# Patient Record
Sex: Female | Born: 1935 | Race: White | Hispanic: No | Smoking: Never smoker
Health system: Southern US, Community
[De-identification: ages and names within clinical notes are randomized; demographics above are authoritative.]

## PROBLEM LIST (undated history)

## (undated) DIAGNOSIS — E079 Disorder of thyroid, unspecified: Secondary | ICD-10-CM

## (undated) DIAGNOSIS — I10 Essential (primary) hypertension: Secondary | ICD-10-CM

## (undated) DIAGNOSIS — M549 Dorsalgia, unspecified: Secondary | ICD-10-CM

## (undated) DIAGNOSIS — E039 Hypothyroidism, unspecified: Secondary | ICD-10-CM

## (undated) DIAGNOSIS — K269 Duodenal ulcer, unspecified as acute or chronic, without hemorrhage or perforation: Secondary | ICD-10-CM

## (undated) DIAGNOSIS — C801 Malignant (primary) neoplasm, unspecified: Secondary | ICD-10-CM

## (undated) DIAGNOSIS — M4800 Spinal stenosis, site unspecified: Secondary | ICD-10-CM

## (undated) DIAGNOSIS — Z974 Presence of external hearing-aid: Secondary | ICD-10-CM

## (undated) DIAGNOSIS — R600 Localized edema: Secondary | ICD-10-CM

## (undated) DIAGNOSIS — M1990 Unspecified osteoarthritis, unspecified site: Secondary | ICD-10-CM

## (undated) DIAGNOSIS — R011 Cardiac murmur, unspecified: Secondary | ICD-10-CM

## (undated) DIAGNOSIS — E785 Hyperlipidemia, unspecified: Secondary | ICD-10-CM

## (undated) HISTORY — DX: Essential (primary) hypertension: I10

## (undated) HISTORY — DX: Localized edema: R60.0

## (undated) HISTORY — DX: Hyperlipidemia, unspecified: E78.5

## (undated) HISTORY — PX: EYE SURGERY: SHX253

## (undated) HISTORY — PX: TONSILLECTOMY: SUR1361

## (undated) HISTORY — DX: Unspecified osteoarthritis, unspecified site: M19.90

## (undated) HISTORY — DX: Disorder of thyroid, unspecified: E07.9

## (undated) HISTORY — PX: COLONOSCOPY: SHX174

## (undated) HISTORY — PX: ABDOMINAL HYSTERECTOMY: SHX81

## (undated) HISTORY — DX: Malignant (primary) neoplasm, unspecified: C80.1

## (undated) HISTORY — DX: Dorsalgia, unspecified: M54.9

---

## 1967-03-27 HISTORY — PX: HERNIA REPAIR: SHX51

## 1994-03-26 DIAGNOSIS — C801 Malignant (primary) neoplasm, unspecified: Secondary | ICD-10-CM

## 1994-03-26 HISTORY — PX: BREAST SURGERY: SHX581

## 1994-03-26 HISTORY — DX: Malignant (primary) neoplasm, unspecified: C80.1

## 1997-08-27 ENCOUNTER — Ambulatory Visit (HOSPITAL_COMMUNITY): Admission: RE | Admit: 1997-08-27 | Discharge: 1997-08-27 | Payer: Self-pay | Admitting: Orthopedic Surgery

## 1997-08-31 ENCOUNTER — Other Ambulatory Visit: Admission: RE | Admit: 1997-08-31 | Discharge: 1997-08-31 | Payer: Self-pay | Admitting: Urology

## 1997-09-09 ENCOUNTER — Ambulatory Visit (HOSPITAL_COMMUNITY): Admission: RE | Admit: 1997-09-09 | Discharge: 1997-09-09 | Payer: Self-pay | Admitting: Gastroenterology

## 1998-10-21 ENCOUNTER — Other Ambulatory Visit: Admission: RE | Admit: 1998-10-21 | Discharge: 1998-10-21 | Payer: Self-pay | Admitting: Rheumatology

## 1999-01-30 ENCOUNTER — Encounter: Payer: Self-pay | Admitting: Rheumatology

## 1999-01-30 ENCOUNTER — Encounter: Admission: RE | Admit: 1999-01-30 | Discharge: 1999-01-30 | Payer: Self-pay | Admitting: Rheumatology

## 2000-01-31 ENCOUNTER — Encounter: Payer: Self-pay | Admitting: General Surgery

## 2000-01-31 ENCOUNTER — Encounter: Admission: RE | Admit: 2000-01-31 | Discharge: 2000-01-31 | Payer: Self-pay | Admitting: General Surgery

## 2000-07-16 ENCOUNTER — Encounter: Admission: RE | Admit: 2000-07-16 | Discharge: 2000-07-16 | Payer: Self-pay | Admitting: Rheumatology

## 2000-07-16 ENCOUNTER — Encounter: Payer: Self-pay | Admitting: Rheumatology

## 2001-01-31 ENCOUNTER — Encounter: Admission: RE | Admit: 2001-01-31 | Discharge: 2001-01-31 | Payer: Self-pay | Admitting: Rheumatology

## 2001-01-31 ENCOUNTER — Encounter: Payer: Self-pay | Admitting: Rheumatology

## 2001-05-27 ENCOUNTER — Ambulatory Visit (HOSPITAL_COMMUNITY): Admission: RE | Admit: 2001-05-27 | Discharge: 2001-05-27 | Payer: Self-pay | Admitting: Gastroenterology

## 2002-02-04 ENCOUNTER — Encounter: Admission: RE | Admit: 2002-02-04 | Discharge: 2002-02-04 | Payer: Self-pay | Admitting: General Surgery

## 2002-02-04 ENCOUNTER — Encounter: Payer: Self-pay | Admitting: General Surgery

## 2003-02-09 ENCOUNTER — Encounter: Admission: RE | Admit: 2003-02-09 | Discharge: 2003-02-09 | Payer: Self-pay | Admitting: General Surgery

## 2004-02-10 ENCOUNTER — Encounter: Admission: RE | Admit: 2004-02-10 | Discharge: 2004-02-10 | Payer: Self-pay | Admitting: Rheumatology

## 2004-08-16 ENCOUNTER — Encounter: Admission: RE | Admit: 2004-08-16 | Discharge: 2004-08-16 | Payer: Self-pay | Admitting: Rheumatology

## 2005-02-14 ENCOUNTER — Encounter: Admission: RE | Admit: 2005-02-14 | Discharge: 2005-02-14 | Payer: Self-pay | Admitting: General Surgery

## 2005-03-26 HISTORY — PX: ERCP: SHX60

## 2005-08-28 ENCOUNTER — Encounter: Admission: RE | Admit: 2005-08-28 | Discharge: 2005-08-28 | Payer: Self-pay | Admitting: *Deleted

## 2006-01-17 ENCOUNTER — Encounter (INDEPENDENT_AMBULATORY_CARE_PROVIDER_SITE_OTHER): Payer: Self-pay | Admitting: Specialist

## 2006-01-17 ENCOUNTER — Ambulatory Visit (HOSPITAL_COMMUNITY): Admission: RE | Admit: 2006-01-17 | Discharge: 2006-01-17 | Payer: Self-pay | Admitting: Gastroenterology

## 2006-02-18 ENCOUNTER — Encounter: Admission: RE | Admit: 2006-02-18 | Discharge: 2006-02-18 | Payer: Self-pay | Admitting: Rheumatology

## 2006-12-13 ENCOUNTER — Ambulatory Visit: Payer: Self-pay | Admitting: Surgery

## 2006-12-13 ENCOUNTER — Ambulatory Visit (HOSPITAL_COMMUNITY): Admission: RE | Admit: 2006-12-13 | Discharge: 2006-12-13 | Payer: Self-pay | Admitting: Rheumatology

## 2006-12-13 ENCOUNTER — Encounter (INDEPENDENT_AMBULATORY_CARE_PROVIDER_SITE_OTHER): Payer: Self-pay | Admitting: Rheumatology

## 2007-02-21 ENCOUNTER — Encounter: Admission: RE | Admit: 2007-02-21 | Discharge: 2007-02-21 | Payer: Self-pay | Admitting: Rheumatology

## 2007-03-27 HISTORY — PX: OTHER SURGICAL HISTORY: SHX169

## 2007-07-11 ENCOUNTER — Encounter: Admission: RE | Admit: 2007-07-11 | Discharge: 2007-07-11 | Payer: Self-pay | Admitting: Orthopedic Surgery

## 2007-07-16 ENCOUNTER — Encounter: Admission: RE | Admit: 2007-07-16 | Discharge: 2007-07-16 | Payer: Self-pay | Admitting: Orthopedic Surgery

## 2007-07-17 ENCOUNTER — Ambulatory Visit (HOSPITAL_BASED_OUTPATIENT_CLINIC_OR_DEPARTMENT_OTHER): Admission: RE | Admit: 2007-07-17 | Discharge: 2007-07-17 | Payer: Self-pay | Admitting: Orthopedic Surgery

## 2007-07-31 ENCOUNTER — Encounter: Admission: RE | Admit: 2007-07-31 | Discharge: 2007-09-25 | Payer: Self-pay | Admitting: Orthopedic Surgery

## 2008-02-23 ENCOUNTER — Encounter: Admission: RE | Admit: 2008-02-23 | Discharge: 2008-02-23 | Payer: Self-pay | Admitting: Rheumatology

## 2008-05-22 ENCOUNTER — Inpatient Hospital Stay (HOSPITAL_COMMUNITY): Admission: EM | Admit: 2008-05-22 | Discharge: 2008-05-26 | Payer: Self-pay | Admitting: Emergency Medicine

## 2008-05-24 ENCOUNTER — Encounter (INDEPENDENT_AMBULATORY_CARE_PROVIDER_SITE_OTHER): Payer: Self-pay | Admitting: Internal Medicine

## 2008-05-24 ENCOUNTER — Ambulatory Visit: Payer: Self-pay | Admitting: *Deleted

## 2009-02-23 ENCOUNTER — Encounter: Admission: RE | Admit: 2009-02-23 | Discharge: 2009-02-23 | Payer: Self-pay | Admitting: Geriatric Medicine

## 2009-03-30 ENCOUNTER — Encounter: Admission: RE | Admit: 2009-03-30 | Discharge: 2009-06-28 | Payer: Self-pay | Admitting: Orthopedic Surgery

## 2009-09-13 ENCOUNTER — Encounter: Admission: RE | Admit: 2009-09-13 | Discharge: 2009-09-13 | Payer: Self-pay | Admitting: Orthopedic Surgery

## 2009-10-13 ENCOUNTER — Encounter: Admission: RE | Admit: 2009-10-13 | Discharge: 2009-12-08 | Payer: Self-pay | Admitting: Unknown Physician Specialty

## 2010-02-04 ENCOUNTER — Inpatient Hospital Stay (HOSPITAL_COMMUNITY)
Admission: EM | Admit: 2010-02-04 | Discharge: 2010-02-05 | Payer: Self-pay | Source: Home / Self Care | Admitting: Emergency Medicine

## 2010-02-20 ENCOUNTER — Ambulatory Visit (HOSPITAL_COMMUNITY): Admission: RE | Admit: 2010-02-20 | Payer: Self-pay | Admitting: Geriatric Medicine

## 2010-02-23 ENCOUNTER — Ambulatory Visit (HOSPITAL_COMMUNITY)
Admission: RE | Admit: 2010-02-23 | Discharge: 2010-02-23 | Payer: Self-pay | Source: Home / Self Care | Admitting: Geriatric Medicine

## 2010-02-24 ENCOUNTER — Encounter: Admission: RE | Admit: 2010-02-24 | Discharge: 2010-02-24 | Payer: Self-pay | Admitting: General Surgery

## 2010-03-26 HISTORY — PX: CARPAL TUNNEL RELEASE: SHX101

## 2010-05-30 ENCOUNTER — Other Ambulatory Visit: Payer: Self-pay | Admitting: Gastroenterology

## 2010-06-06 LAB — DIFFERENTIAL
Basophils Absolute: 0 10*3/uL (ref 0.0–0.1)
Basophils Relative: 0 % (ref 0–1)
Eosinophils Absolute: 0.2 10*3/uL (ref 0.0–0.7)
Eosinophils Relative: 2 % (ref 0–5)
Lymphocytes Relative: 22 % (ref 12–46)
Lymphs Abs: 3.4 10*3/uL (ref 0.7–4.0)
Monocytes Absolute: 1.1 10*3/uL — ABNORMAL HIGH (ref 0.1–1.0)
Neutro Abs: 11.7 10*3/uL — ABNORMAL HIGH (ref 1.7–7.7)
Neutrophils Relative %: 75 % (ref 43–77)

## 2010-06-06 LAB — HEPATIC FUNCTION PANEL
ALT: 12 U/L (ref 0–35)
Bilirubin, Direct: 0.1 mg/dL (ref 0.0–0.3)
Indirect Bilirubin: 0.3 mg/dL (ref 0.3–0.9)

## 2010-06-06 LAB — GLUCOSE, CAPILLARY: Glucose-Capillary: 104 mg/dL — ABNORMAL HIGH (ref 70–99)

## 2010-06-06 LAB — CBC
MCH: 30.4 pg (ref 26.0–34.0)
MCH: 30.4 pg (ref 26.0–34.0)
MCHC: 33.4 g/dL (ref 30.0–36.0)
Platelets: 235 10*3/uL (ref 150–400)
Platelets: 278 10*3/uL (ref 150–400)
RBC: 3.26 MIL/uL — ABNORMAL LOW (ref 3.87–5.11)
RDW: 13.6 % (ref 11.5–15.5)
WBC: 15.3 10*3/uL — ABNORMAL HIGH (ref 4.0–10.5)

## 2010-06-06 LAB — POCT I-STAT, CHEM 8
Creatinine, Ser: 1.4 mg/dL — ABNORMAL HIGH (ref 0.4–1.2)
Glucose, Bld: 94 mg/dL (ref 70–99)
HCT: 33 % — ABNORMAL LOW (ref 36.0–46.0)
Hemoglobin: 11.2 g/dL — ABNORMAL LOW (ref 12.0–15.0)
TCO2: 24 mmol/L (ref 0–100)

## 2010-06-06 LAB — TSH: TSH: 1.854 u[IU]/mL (ref 0.350–4.500)

## 2010-06-06 LAB — LIPID PANEL
HDL: 56 mg/dL (ref >39)
Triglycerides: 110 mg/dL (ref <150)
VLDL: 22 mg/dL (ref 0–40)

## 2010-06-06 LAB — MAGNESIUM: Magnesium: 2 mg/dL (ref 1.5–2.5)

## 2010-06-06 LAB — COMPREHENSIVE METABOLIC PANEL
AST: 15 U/L (ref 0–37)
Albumin: 2.8 g/dL — ABNORMAL LOW (ref 3.5–5.2)
Calcium: 8.6 mg/dL (ref 8.4–10.5)
Chloride: 104 mEq/L (ref 96–112)
Creatinine, Ser: 1.01 mg/dL (ref 0.4–1.2)
GFR calc Af Amer: >60 mL/min (ref >60)
Sodium: 139 mEq/L (ref 135–145)
Total Bilirubin: 0.4 mg/dL (ref 0.3–1.2)

## 2010-06-06 LAB — LIPASE, BLOOD: Lipase: 20 U/L (ref 11–59)

## 2010-06-06 LAB — T4, FREE: Free T4: 1.23 ng/dL (ref 0.80–1.80)

## 2010-07-06 LAB — BASIC METABOLIC PANEL
BUN: 12 mg/dL (ref 6–23)
BUN: 18 mg/dL (ref 6–23)
CO2: 29 mEq/L (ref 19–32)
CO2: 31 mEq/L (ref 19–32)
Chloride: 95 mEq/L — ABNORMAL LOW (ref 96–112)
Chloride: 99 mEq/L (ref 96–112)
Creatinine, Ser: 1.03 mg/dL (ref 0.4–1.2)
Creatinine, Ser: 1.04 mg/dL (ref 0.4–1.2)

## 2010-07-11 LAB — POCT CARDIAC MARKERS
CKMB, poc: 1.8 ng/mL (ref 1.0–8.0)
Myoglobin, poc: 132 ng/mL (ref 12–200)
Troponin i, poc: <0.05 ng/mL (ref 0.00–0.09)

## 2010-07-11 LAB — CBC
HCT: 30.7 % — ABNORMAL LOW (ref 36.0–46.0)
MCV: 90.5 fL (ref 78.0–100.0)
Platelets: 387 10*3/uL (ref 150–400)
RDW: 14.4 % (ref 11.5–15.5)
WBC: 10.3 10*3/uL (ref 4.0–10.5)

## 2010-07-11 LAB — DIFFERENTIAL
Basophils Relative: 0 % (ref 0–1)
Eosinophils Absolute: 0.3 10*3/uL (ref 0.0–0.7)
Eosinophils Relative: 3 % (ref 0–5)
Lymphs Abs: 1.6 10*3/uL (ref 0.7–4.0)
Monocytes Relative: 6 % (ref 3–12)

## 2010-07-11 LAB — BASIC METABOLIC PANEL
BUN: 10 mg/dL (ref 6–23)
CO2: 28 mEq/L (ref 19–32)
Chloride: 101 mEq/L (ref 96–112)
Creatinine, Ser: 1.05 mg/dL (ref 0.4–1.2)

## 2010-07-11 LAB — COMPREHENSIVE METABOLIC PANEL
ALT: 15 U/L (ref 0–35)
AST: 17 U/L (ref 0–37)
Alkaline Phosphatase: 60 U/L (ref 39–117)
BUN: 12 mg/dL (ref 6–23)
Chloride: 102 mEq/L (ref 96–112)
Creatinine, Ser: 0.94 mg/dL (ref 0.4–1.2)

## 2010-07-11 LAB — METANEPHRINES, URINE, 24 HOUR
Metaneph Total, Ur: 506 mcg/24 h (ref 224–832)
Normetanephrine, 24H Ur: 387 mcg/24 h (ref 122–676)
Volume, Urine-METAN: 2200 mL

## 2010-07-11 LAB — LIPID PANEL
LDL Cholesterol: 130 mg/dL — ABNORMAL HIGH (ref 0–99)
Total CHOL/HDL Ratio: 4 RATIO
Triglycerides: 133 mg/dL (ref <150)
VLDL: 27 mg/dL (ref 0–40)

## 2010-07-11 LAB — CATECHOLAMINES, FRACTIONATED, URINE, 24 HOUR
Norepinephrine 24 Hr Urine: 64 ug/24hr (ref <80)
Total urine volume: 2200 mL

## 2010-08-08 NOTE — H&P (Signed)
[name], FURNEY            ACCOUNT NO.:  [id number]   MEDICAL RECORD NO.:  [id number]          PATIENT TYPE:  INP   LOCATION:  3105                         FACILITY:  [facility]   PHYSICIAN:  Corinna L. Lendell Caprice, MDDATE OF BIRTH:  Aug 20, 1926   DATE OF ADMISSION:  05/22/2008  DATE OF DISCHARGE:                              HISTORY & PHYSICAL   CHIEF COMPLAINT:  Left arm numbness and weakness.   HISTORY OF PRESENT ILLNESS:  Ms. [place] is an 77 year old white female  patient of Dr. Coral Spikes with a history of malignant hypertension, who  presents after waking up with left arm paresthesias and weakness.  She  was unable to grasp an object.  She also has some tingling of her face  according to nursing notes.  She also had a periorbital headache, which  she attributed to her chronic sinusitis.  Her symptoms have since  resolved.  She denies any shortness of breath.  She had no change in  speech, swallow, or gait.  She has never had a similar episode.  She had  no seizure activity.  She has been taking Tylenol Sinus and Tylenol  Multisystem, both of which, I believe have phenylephrine.  Also, she  reports that she has had refractory hypertension for which, she was  referred to Dr. Eldridge Dace.  The nurse practitioner has been adjusting her  medications without much result.  She also has had leg swelling and was  scheduled for an outpatient echocardiogram.  She has had no chest pain.  She has no history of heart disease.  She says that she is compliant  with diet, but when I asked what she ate for breakfast, she said a  sausage biscuit from [employer].  I suspect that she does not comply  with a low-salt diet.  She was found to have malignant hypertension in  the emergency room and was given several doses of labetalol, IV Vasotec,  Lasix, and nitroglycerin ointment.  Her blood pressure has remained  elevated.   PAST MEDICAL HISTORY:  1. Hypertension, poorly controlled.  2.  Acroarthritis.  3. Leg swelling as above.  4. Hypothyroidism.  5. Chronic sinusitis.   MEDICATIONS:  1. Nasacort as needed.  She has not been using it recently.  2. Nexium 40 mg a day.  3. Synthroid 150 mcg a day.  4. Multivitamin a day.  5. Vitamin D daily.  6. Tylenol as needed.  7. Tekturna 150 mg a day.  It had been on 300 mg a day.  8. Atenolol 50 mg a day.  9. Lisinopril 40 mg a day.  10.Cardura 2 mg a day.   SOCIAL HISTORY:  The patient does not smoke or drink.  She is fairly  independent.  She is here with her daughter, who lives in [place].  Apparently, there is a son who lives in town, who is a respiratory  therapist.   FAMILY HISTORY:  Significant for hypertension.   REVIEW OF SYSTEMS:  As above, otherwise negative.   PHYSICAL EXAMINATION:  VITAL SIGNS:  Temperature is 98, blood pressure  initially 212/85.  Her diastolic blood pressure here  in the emergency  room has been up to 120.  Currently, she has blood pressure of 200/80,  pulse 72, respiratory rate 16, oxygen saturation 94% on room air.  GENERAL:  The patient is an elderly white female in no acute distress.  HEENT:  Normocephalic, atraumatic.  Pupils equal, round, and reactive to  light.  She has no papilledema that I can appreciate, though funduscopic  exam is somewhat limited.  She has moist mucous membranes.  There is no  facial droop.  NECK:  Supple.  No carotid bruits.  No JVD.  LUNGS:  Clear to auscultation bilaterally without wheezes, rhonchi, or  rales.  CARDIOVASCULAR:  Regular rate and rhythm without murmurs, gallops, or  rubs.  ABDOMEN:  Obese, soft, nontender.  GU:  Deferred.  RECTAL:  Deferred.  EXTREMITIES:  2+ pitting edema bilaterally.  She has no calf tenderness.  Homans sign negative.  SKIN:  No rash.  PSYCHIATRIC:  The patient is calm and cooperative with normal affect.  NEUROLOGIC:  Alert and oriented x3.  Cranial nerves are intact.  Motor  strength 5/5 throughout.  Diminished deep  tendon reflexes, but  symmetric.  Babinski negative.  I did not test gait.  Finger-to-nose  normal.  Speech clear and fluent.   LABORATORY DATA:  CBC significant for hemoglobin of 10.7, hematocrit  30.7.  PT/PTT normal.  Complete metabolic panel significant for  potassium of 3.1, otherwise unremarkable.  BNP is 275.  Point-of-care  enzymes negative.  EKG shows normal sinus rhythm with PACs, wandering  baseline.  Chest x-ray shows cardiomegaly, interstitial edema, and tiny  bilateral pleural effusions.  CT brain shows nothing acute.  ED  physician ordered an x-ray of the C-spine, which showed degenerative  disk disease.   ASSESSMENT AND PLAN:  1. Hypertensive urgency:  The patient has congestive heart failure.  I      will start a nitroglycerin drip and titrate to a systolic blood      pressure of less than 170.  I will continue her outpatient      medications, increase Cardura to 4 mg a day.  She will be placed in      step-down unit.  I will review outpatient records to see what type      of workup has been done for her uncontrolled hypertension.  It      sounds as though she has not had a workup for pheochromocytoma or      renovascular hypertension.  The electronic medical records are      currently down.  2. Transient ischemic attack.  She will get an MRI, carotid Dopplers,      fasting lipids, homocysteine, echocardiogram.  I will continue      aspirin, but change it to 325 mg a day.  3. New-onset congestive heart failure.  She will get Lasix.  She needs      better blood pressure control.  I suspect she has at least a      component of diastolic dysfunction, but rule out systolic      dysfunction.  She needs tight blood pressure control.  I will check      a TSH as well.  4. Hypothyroidism.  Check TSH.  5. Chronic sinusitis.  Certainly, her phenylephrine use may be      contributing to her malignant hypertension and I      have recommended that she not resume this upon  discharge.  6. Osteoarthritis.  7. Hypokalemia.  This will be repleted.      Corinna L. Lendell Caprice, MD  Electronically Signed     CLS/MEDQ  D:  05/23/2008  T:  05/23/2008  Job:  [id number]   cc:   Corky Crafts, MD  Demetria Pore. Coral Spikes, M.D.

## 2010-08-08 NOTE — Op Note (Signed)
[name], [name]            ACCOUNT NO.:  [id number]   MEDICAL RECORD NO.:  [id number]          PATIENT TYPE:  AMB   LOCATION:  DSC                          FACILITY:  [facility]   PHYSICIAN:  Rodney A. Mortenson, M.D.DATE OF BIRTH:  22-Aug-1926   DATE OF PROCEDURE:  DATE OF DISCHARGE:                               OPERATIVE REPORT   JUSTIFICATION:  A 77 year old female presents with 6-week history of  significant pain about the right shoulder.  No radicular symptoms in the  upper extremity.  She has inability to abduct her shoulder with great  deal of pain.  She has significant weakness to external rotation.  Impingement test is very painful.  X-rays show a type 2 acromion.  MRI  was done that shows a partial tear of the biceps and a full-thickness  rupture of the supraspinatus with retraction about 3 to 4 cm and full-  width tear of the infraspinatus with retraction.  Because of persistent  pain, discomfort, and a massive size of the cuff tear, it was felt that  surgical intervention was indicated necessary.  Questions were answered  preoperatively.  Complication was discussed.   PREOPERATIVE DIAGNOSIS:  Partial tear, right biceps; full-thickness  retracted tear of supraspinatus, infraspinatus, right shoulder.   POSTOPERATIVE DIAGNOSIS:  Partial tear right biceps; full-thickness  retracted tear of supraspinatus, infraspinatus right shoulder.   OPERATION:  Arthroscopic debridement, biceps, labrum; open  subacromioplasty and open repair, rotator cuff using a large Biomet  corkscrew with sutures.   SURGEON:  Lenard Galloway. Chaney Malling, M.D.   ANESTHESIA:  General.   PROCEDURE:  The patient placed on the operating table in supine  position.  After satisfactory general anesthesia, the patient was placed  in a semi-sitting position.  The right upper extremity was prepped with  DuraPrep and draped down in the usual manner.  Through a posterior  portal, the arthroscope was introduced and  the anterior operative portal  was inserted.  Glenohumeral joint was very careful examined.  The  articular cartilage of the humeral head and glenoid appeared normal.  There was some fraying of the labrum, and there was a partial tear about  50% with the biceps, which was shredded and frayed and hanging down into  the joint.  There is a massive  rotator cuff tear and from the  glenohumeral joint one could see the acromion with a large retracted  tear.  A Gator resector was entered anteriorly.  Labrum was debrided.  The fraying and tearing of the biceps also debrided.  Part of the cuff  was then trimmed back.  Once this accomplished to my satisfaction, the  arthroscope was then removed.   Saber-cut incision made over the anterolateral aspect of right shoulder.  Skin edges were retracted.  Bleeders were coagulated.  Deltoid fibers  released off the anterior aspect of acromion for about a centimeter to  the centimeter and a half.  The fibers were then split distally.  Excellent visualization was achieved.  There was a massive a rotator  cuff tear.  The supraspinatus and infraspinatus were totally avulsed  from the humeral head.  It was totally bare and the footprint could  clearly be seen.  The biceps was in the groove.  The footprint was then  debrided with a rongeur.  Two leaves of the rotator cuff could be pulled  down to the humeral head in the area of the footprint.  A large  corkscrew Biomet screw was inserted, and the FiberWire sutures were used  to pass through posterior leaf in the anterior leaf, then sutured to  each other.  The cuff was pulled down to an anatomic position, and  excellent, very stable repair was achieved.  Several other sutures were  passed in to complete the repair.  The shoulders were put through full  range of motion.  There was no impingement on the undersurface acromion,  which had been partially resected earlier in the case.  The shoulder was  irrigated with  copious amount of saline solution.  Deltoid fibers were  reattached with 0-Vicryl; 2-0 Vicryl is used to close the subcutaneous  tissue, and stainless steel staples used to close the skin.  Sterile  dressing applied, and the patient returned to the recovery room in  excellent condition.  Technically, this procedure went extremely well.   DISPOSITION:  1. Discharge to home.  2. Percocet for pain.  3. To my office on Wednesday.  4. We will start immediate physical therapy.  A watertight closure was      achieved.      Rodney A. Chaney Malling, M.D.  Electronically Signed     RAM/MEDQ  D:  07/17/2007  T:  07/18/2007  Job:  [id number]

## 2010-08-08 NOTE — Consult Note (Signed)
NAMEDANIYLA, PFAHLER            ACCOUNT NO.:  [id number]   MEDICAL RECORD NO.:  [id number]          PATIENT TYPE:  INP   LOCATION:  3712                         FACILITY:  [facility]   PHYSICIAN:  Marlan Palau, M.D.  DATE OF BIRTH:  06-19-1926   DATE OF CONSULTATION:  05/24/2008  DATE OF DISCHARGE:                                 CONSULTATION   REASON FOR CONSULTATION:  Left arm weakness.   HISTORY OF PRESENT ILLNESS:  Ms. Pickron is an 77 year old white female  who is a patient with a significant past medical history.  However, her  past medical history does not include TIA or stroke.  The patient does  have a history of migraine headaches but has not had migraine headaches  in quite a long time.  The patient states that on Saturday morning, she  woke up and drove her son to a designated location, got back in her car,  drove to [employer] to pick up a biscuit.  On her way home she noticed  some tingling in her left arm.  By the time she had arrived at her  house, the patient noted that she was unable to grasp objects with her  left hand and she had numbness and tingling from her fingertips to  just above her elbow.  Although Ms. Miao does have bursitis/tendinitis  of her left shoulder, she noticed significant amount of weakness and  inability to move that arm.  She states that this weakness and tingling  lasted for approximately 1 hour and all symptoms resolved.  In addition  to these problems, the patient also has very labile hypertension.  Dr.  Hoyle Barr nurse practitioner has been adjusting the patient's  medications without much result.  In the past, she has been having some  significant amount of leg swelling and was scheduled for outpatient  echocardiogram.  According to notes on May 19, 2008, the patient's  BNP returned at the level of 676.  The patient was started on Lasix 20  mg p.o. daily for 5 days only.  She was also given of TED hose.  At  present time, the  patient is alert.  She is not complaining of any  paresthesia, numbness, tingling, weakness, dysarthria, difficulty  swallowing, difficulty with vision, difficulty walking, abdominal pain,  shortness of breath, or chest pain.  She believes that she does well  with her diet but does admit that she needs to work on her cholesterol  and exercise more.  However, the patient has significant amount of  osteoarthritis and finds it very difficult.  According to the ED notes,  on the patient's arrival to the ED on May 22, 2008, her blood  pressure was 212/85 at 10:33, brought down to 172/86 by 1846.  At  present time, the patient's blood pressure is back to 178/80.  As  stated, the patient does not feel any chest pain.   MEDICATIONS:  She is currently on:  1. Nexium 40 mg as directed.  2. Synthroid 115 mcg 1 tablet every morning on empty stomach once a      day.  3.  Multivitamin daily.  4. Vitamin D 1 tablet daily.  5. Tylenol Extra Strength 500 mg 2 tablets daily.  6. Tekturna 150 mg 1 tablet daily.  7. Atenolol 50 mg 1 tablet daily.  8. Lisinopril 40 mg 1 tablet daily.  9. Cardura 2 mg 1 tablet daily.  The patient states that she does take aspirin 81 mg, and she has been  started on 325 mg daily during this visit.   PAST MEDICAL HISTORY:  1. Status post RAI treatment for hyperthyroidism, subsequent      hypothyroid on treatment.  2. Hypertension.  3. Proteinuria secondary to hypertension.  4. Hyperlipidemia.  5. Bilateral cataracts.  6. Microhematuria.  7. EKG nonspecific ST changes  8. Impaired sugar.  9. Osteoarthritis to the left knee.  10.Status post low back pain in 2007.  X-ray shows facet DJD and      spondylolisthesis, L4-5.  11.Esophageal stricture with dysphagia in 2007 with food impaction      removed endoscopically with stricture but not dilated.  Subsequent      dilation of stricture.  12.Allergic rhinitis.  13.Ganglion on left second finger, resolved.   14.Hypertrophy of left ventricle, systolic function ejection fraction      65-70%, at that time no further workup.   ALLERGIES:  AMLODIPINE, swelling is the side effect.   FAMILY HISTORY:  Father deceased at 95 years of age.  Mother deceased at  45.   SOCIAL HISTORY:  Does not smoke, does not drink.  Caffeine one serving  per day.  Exercise, none.  Marital status, she is a widow.  Children, 2,  living at this time.   REVIEW OF SYSTEMS:  All negative with the exception of above.   PHYSICAL EXAMINATION:  VITAL SIGNS:  Blood pressure is 178/80, pulse 69,  respiratory rate 20, and temperature 97.9.  NEUROLOGIC:  Mental status:  She is alert and she is oriented x3.  She  holds the conversation well, oftentimes getting sidetracked.  However,  she is extremely compliant and very knowledgeable about her past.  Cranial nerves:  Pupils equal, round, and reactive to light and  accommodating, conjugate gaze.  Extraocular muscles intact.  Visual  fields intact.  Face symmetrical.  Tongue midline.  Uvula midline.  Sensation V1 through V3 is full.  Shoulder shrug, head turn full.  Coordination:  Finger-to-nose is smooth.  Heel-to-shin is smooth.  Fine  motor movements are within normal limits.  Gait is slow.  Stoop  secondary to kyphosis.  Motor is 5/5 in all extremities.  No drift.  No  clonus.  She has good bulk and good tone.  She has a decreased range of  motion in her left knee secondary to osteoarthritis.  Deep tendon  reflexes are 2+ throughout with bilateral downgoing toes.  Sensation is  full with the exception of distal stocking distribution bilaterally in  the legs to pinprick, light touch, and vibration.  PULMONARY:  Clear to auscultation bilaterally.  No rhonchi or wheezing.  CARDIOVASCULAR:  S1 and S2.  Regular rate and rhythm.  No murmurs.  NECK:  Negative for bruits and supple.   LABORATORY DATA:  Her TSH was within normal limits.  Homocysteine 17.5.  Hemoglobin is 10.7,  hematocrit is 30.7, white blood cell is 10.3, and  platelets are 387.  Capillary glucose 103.  Sodium 135, potassium 3.4,  chloride 99, CO2 of 31, BUN 12, creatinine 1.04, and calcium 8.7.  CK is  1.8, troponin is less than 0.05,  myoglobin is 132, and BNP is 275.  Cholesterol 209, triglycerides 133, HDL is 52, and LDL is 130.  TSH is  2.442.   IMAGING TEST:  MRI of the brain shows 3 punctate foci of acute infarct  in the right MCA territory.  Preliminary carotid Doppler showed no ICA  stenosis bilaterally.  CT of head shows negative for bleed or mass.   ASSESSMENT:  At this time, this is a pleasant 77 year old Caucasian  female with acute onset of left arm weakness and numbness which resolved  in approximately 1 hour.  MRI showing punctate lesions in the right MCA  distribution.  The patient has already been on 81 mg of aspirin.  We  would recommend increasing to 325 mg aspirin daily.  We will obtain a 2-  D echo.  We will obtain an MRA of the head and MRA of the neck.  We will  continue to follow this patient while she is in-house.  Thank you very  much for this consultation.     ______________________________  Felicie Morn, PA-C      C. Lesia Sago, M.D.  Electronically Signed    DS/MEDQ  D:  05/24/2008  T:  05/25/2008  Job:  [id number]   cc:   [employer] [employer]  Marlan Palau, M.D.

## 2010-08-11 NOTE — Op Note (Signed)
[name], [name]            ACCOUNT NO.:  [id number]   MEDICAL RECORD NO.:  [id number]          PATIENT TYPE:  AMB   LOCATION:  ENDO                         FACILITY:  [facility]   PHYSICIAN:  Petra Kuba, M.D.    DATE OF BIRTH:  Sep 17, 1926   DATE OF PROCEDURE:  01/17/2006  DATE OF DISCHARGE:                                 OPERATIVE REPORT   PROCEDURE:  Esophagogastroduodenoscopy with biopsy of food disimpaction.   INDICATIONS:  Food impaction.  Consent was signed after risks, benefits,  methods, and options were thoroughly discussed by both the nurse  practitioner [name] as well as me prior to sedation.   MEDICINES USED:  Fentanyl 50 mcg, Versed 6 mg.   PROCEDURE:  The video duodenoscope was inserted by direct vision.  The  proximal and mid esophagus was normal. In the distal esophagus was an  obvious piece of food obstructing the esophagus. We went ahead and snared it  but unfortunately the snare cut through it and we went ahead and snared  multiple pieces and cut them into smaller pieces using the snare.  One piece  we did not cut through and this was suctioned onto the head of the scope and  the piece of meat and scope were removed, the piece was recovered. When we  reinserted the scope, there were just a few small particles left in the  esophagus which could easily be suctioned through the scope or pushed into  the stomach. The GE junction was patent.  She did have a small stricture at  the GE junction with minimal inflammation.  We withdrew back through the  esophagus.  No food was seen.  We went ahead and inserted the stomach and  advanced to the antrum where a small sessile antral polyp was seen.  This  was cold biopsied x4. The pylorus was scarred and moderately patent and we  could advanced into a normal duodenal bulb around the C-loop to a normal  second portion of the duodenum.  The scope was withdrawn back to the bulb  and a good look there ruled out ulcers in  that location.  The scope was  withdrawn back to the stomach and retroflexed.  Angularis, cardia, fundus,  lesser and greater curve were all normal except for possibly some atrophic  gastritis and some food in the proximal part of the stomach. Straight  visualization of the stomach did not reveal any additional findings.  The  biopsies of the antral polyps were obtained at this junction.  Air was  suctioned, the scope slowly withdrawn. Again, a good look at the esophagus  was normal except for the distal minimal stricture and inflammation.  The  scope was withdrawn.  The patient tolerated the procedure well.  There was  no obvious immediate complication.   ENDOSCOPIC DIAGNOSES:  1. Food in the distal esophagus status post multiple snares.  2. Distal esophageal stricture with minimal inflammation.  3. Antral sessile polyp, small to medium size status post biopsy.  4. Scarred but patent pylorus.  5. Otherwise, within normal limits EGD.   PLAN:  Await pathology.  May need more aggressive polypectomy, dilation  p.r.n. per Dr. Sherin Schroeder.  We may want to get a barium swallow and barium  tablet to get an idea of how significant this stricture is.  We will go  ahead and  hold aspirin and nonsteroidals for three days. Clear liquids only for four  hours and then slowly advance to soft solids as tolerated.  We will ask her  to get a week of over-the-counter Prilosec. Happy to see back p.r.n.,  otherwise, return care to Dr. Sherin Schroeder for follow up on the path and her  symptoms.           ______________________________  Petra Kuba, M.D.     MEM/MEDQ  D:  01/17/2006  T:  01/18/2006  Job:  [id number]

## 2010-08-11 NOTE — Consult Note (Signed)
NAME:  [name], [name]            ACCOUNT NO.:  [id number]   MEDICAL RECORD NO.:  [id number]          PATIENT TYPE:  AMB   LOCATION:  ENDO                         FACILITY:  [facility]   PHYSICIAN:  Petra Kuba, M.D.    DATE OF BIRTH:  Feb 21, 1927   DATE OF CONSULTATION:  01/17/2006  DATE OF DISCHARGE:                                   CONSULTATION   HISTORY:  The patient was seen at the request of Dr. Patsi Sears, medical  nurse practitioner, and Dr. Boyd Kerbs with a probable food impaction.  She has had this once a month for the last few years.  Usually it is when  she is out to lunch.  This time it was with beef stew.  She was able to  cough much of it up, which she usually does, but she still cannot swallow  water.  It feels like something is caught.  She is really not hurting.  It  has never lasted this long.  She does not have any other reflux or  heartburn.  Has not talked to her doctor about any of this.  Has had no  previous upper tract workup.   PAST MEDICAL HISTORY:  Pertinent for arthritis, high blood pressure, thyroid  problems, a lumpectomy, hernia surgery, hysterectomy, tonsillectomy, a  cancerous polyp removed, and followup non-diagnostic colonoscopy.   FAMILY HISTORY:  Negative for any swallowing problems, colon cancers.   ALLERGIES:  NONE.   MEDICATIONS:  Tenormin, Maxzide, Synthroid, and KCl.   REVIEW OF SYSTEMS:  Negative, except above.   PHYSICAL EXAMINATION:  No acute distress.  Lying comfortably in bed.  Neck  supple without obvious adenopathy.  Lungs are clear.  Regular rate and  rhythm.  Abdomen is soft, nontender.  Good bowel sounds.   ASSESSMENT:  Probable food impaction.   PLAN:  The risks, benefits, and methods of endoscopy and taking the food out  were discussed.  We compared it to the colonoscopy she has had.  We will  proceed ASAP with further workup and plans pending those findings.           ______________________________  Petra Kuba,  M.D.     MEM/MEDQ  D:  01/17/2006  T:  01/18/2006  Job:  [id number]   cc:   Tasia Catchings, M.D.

## 2010-08-11 NOTE — Discharge Summary (Signed)
[name], [name]            ACCOUNT NO.:  [id number]   MEDICAL RECORD NO.:  [id number]          PATIENT TYPE:  INP   LOCATION:  3712                         FACILITY:  [facility]   PHYSICIAN:  Corinna L. Lendell Caprice, MDDATE OF BIRTH:  Aug 26, 1926   DATE OF ADMISSION:  05/22/2008  DATE OF DISCHARGE:  05/26/2008                               DISCHARGE SUMMARY   DISCHARGE DIAGNOSES:  1. Acute stroke secondary to hypertensive urgency.  2. Acute congestive heart failure secondary to hypertensive urgency,      diastolic dysfunction, new onset.  3. Hypothyroidism.  4. Hypokalemia.  5. Chronic sinusitis, on outpatient decongestants.  6. Osteoarthritis.  7. Hyperhomocystinemia.   DISCHARGE MEDICATIONS:  1. Increased doxazosin to 4 mg daily.  2. Increased aspirin to 325 mg a day.  3. Increased atenolol to 100 mg a day.  4. Increased Tekturna to 300 mg a day.  5. Increased Lasix to 40 mg a day.  6. Start K-Dur 20 mEq t.i.d.  7. Foltx daily.  8. Zocor 20 mg daily.  9. Hydralazine 10 mg p.o. q.i.d.  10.Nexium 40 mg a day.  11.Synthroid 150 mcg a day.  12.Lisinopril 40 mg a day.   FOLLOWUP:  Follow up with Dr. Pearlean Schroeder in 2 months, Dr. Eldridge Schroeder in 2 weeks  for blood pressure check, and Dr. Coral Schroeder in 4 weeks.   DIET:  Heart-healthy.   ACTIVITY:  Increase slowly.   CONDITION:  Stable.   CONSULTATIONS:  Neurology.   PROCEDURES:  None.   LABORATORY DATA:  CBC on admission significant for hemoglobin of 10.7,  hematocrit 30.7, otherwise unremarkable.  Basic metabolic panel  significant for potassium of 3.1 on admission and at discharge 3.7.  Liver function tests significant for an albumin of 3.3.  BNP 275.  LDL  130, HDL 52, triglycerides 133, and total cholesterol 209.  TSH 2.442.  A 24-hour urine for catecholamines was unremarkable.  Cardiac enzymes  negative.   SPECIAL STUDIES/RADIOLOGY:  MRA of the neck showed marked image quality  degradation due to motion, but no obvious  stenosis.  MRA of the head was  limited but no definite stenosis.  Repeat MRA of the neck showed suspect  flow reducing lesions in the proximal right common carotid 75-90%, left  common carotid 75-90%, and left subclavian 75-90%.  Chest x-ray on  admission two views showed interstitial edema and tiny bilateral pleural  effusions.  CT brain without contrast on admission normal.  C-spine x-  ray showed degenerative disk disease.  MRI of the brain showed 3  punctate foci of acute infarction within the right middle cerebral  artery territory consistent with embolic disease.  MRA of the abdomen  showed no significant renal artery stenosis.  Repeat chest x-ray showed  improvement of pulmonary edema.  EKG showed normal sinus rhythm with  PVCs, poor R-wave progression, and baseline wandering.  Echocardiogram  showed ejection fraction 60%, moderately increased left ventricular wall  thickness with increased relative contribution of atrial contraction to  left ventricular filling, mild aortic regurgitation, mild mitral  regurgitation, mildly dilated left atrium, and small pericardial  effusion.  Doppler  of the neck showed no significant right or left ICA  stenosis and vertebral artery flow was antegrade.  Doppler of the lower  extremities showed no DVT.   HISTORY AND HOSPITAL COURSE:  Ms. [name] is an 77 year old white female  who presented to the emergency room with left arm numbness and weakness.  She has a history of difficult to control hypertension.  She woke up  with paresthesias and weakness of the left arm.  She was unable to grasp  in a object.  There was also tingling in her face.  She had a  periorbital headache.  She had been taking phenylephrine for chronic  sinusitis.  She is followed by Dr. Eldridge Schroeder for her malignant  hypertension.  She reported dietary compliance with a low-salt diet, but  admitted to going to [place].  On admission, she was noted to have  a blood pressure of  initially 212/85 and her diastolic blood pressure  ranged up to 120 in the emergency room.  She had a nonfocal exam.  Her  symptoms were largely resolved when she presented.  She was admitted to  Step-Down Unit for treatment of hypertensive urgency and suspected  transient ischemic attack.  She also was found to be in congestive heart  failure based on chest x-ray.  She was started on an nitroglycerin drip.  Her Cardura was increased.  Her other outpatient medications were up-  titrated.  She received diuretics for her hypertension.  It was felt  that certainly phenylephrine was contributing to the malignant  hypertension.  A 24-hour urine for metanephrines was normal as was an  MRA of the abdomen to look for renal artery stenosis and renovascular  hypertension.  She had been on an aspirin a day which was continued.  She had on MRI three punctate lesions consistent with acute embolic  stroke.  Cardiology was consulted and the ultimate etiology was felt to  be due to her hypertensive urgency rather than a cardioembolic stroke.  She was found to have an elevated LDL and was started on a statin.  She  was also found to have an elevated homocysteine and was started on  Foltx.  She was instructed not to resume any decongestants.  She was  hypokalemic on admission and this was treated.  Her CHF and blood  pressure improved.  With regard to her abnormal MRA of the neck,  Neurology had recommended no further workup and recommended discharge.      Corinna L. Lendell Caprice, MD  Electronically Signed     CLS/MEDQ  D:  07/14/2008  T:  07/15/2008  Job:  [id number]

## 2010-08-16 ENCOUNTER — Encounter (INDEPENDENT_AMBULATORY_CARE_PROVIDER_SITE_OTHER): Payer: Self-pay | Admitting: General Surgery

## 2010-09-07 ENCOUNTER — Other Ambulatory Visit: Payer: Self-pay | Admitting: Geriatric Medicine

## 2010-09-07 DIAGNOSIS — D381 Neoplasm of uncertain behavior of trachea, bronchus and lung: Secondary | ICD-10-CM

## 2010-09-13 ENCOUNTER — Other Ambulatory Visit: Payer: Medicare Other

## 2010-09-14 ENCOUNTER — Ambulatory Visit
Admission: RE | Admit: 2010-09-14 | Discharge: 2010-09-14 | Disposition: A | Discharge status: Home / Self Care | Payer: Medicare Other | Source: Ambulatory Visit | Attending: Geriatric Medicine | Admitting: Geriatric Medicine

## 2010-09-14 DIAGNOSIS — D381 Neoplasm of uncertain behavior of trachea, bronchus and lung: Secondary | ICD-10-CM

## 2010-09-14 MED ORDER — IOHEXOL 300 MG/ML  SOLN
75.0000 mL | Freq: Once | INTRAMUSCULAR | Status: AC | PRN
  Administered 2010-09-14: 75 mL via INTRAVENOUS

## 2010-09-20 ENCOUNTER — Encounter (HOSPITAL_BASED_OUTPATIENT_CLINIC_OR_DEPARTMENT_OTHER)
Admission: RE | Admit: 2010-09-20 | Discharge: 2010-09-20 | Disposition: A | Discharge status: Home / Self Care | Payer: Medicare Other | Source: Ambulatory Visit | Attending: Orthopedic Surgery | Admitting: Orthopedic Surgery

## 2010-09-20 LAB — BASIC METABOLIC PANEL
Calcium: 8.8 mg/dL (ref 8.4–10.5)
Creatinine, Ser: 0.91 mg/dL (ref 0.50–1.10)
GFR calc non Af Amer: 59 mL/min — ABNORMAL LOW (ref >60)
Glucose, Bld: 93 mg/dL (ref 70–99)
Sodium: 141 mEq/L (ref 135–145)

## 2010-09-22 ENCOUNTER — Ambulatory Visit (HOSPITAL_BASED_OUTPATIENT_CLINIC_OR_DEPARTMENT_OTHER)
Admission: RE | Admit: 2010-09-22 | Discharge: 2010-09-22 | Disposition: A | Discharge status: Home / Self Care | Payer: Medicare Other | Source: Ambulatory Visit | Attending: Orthopedic Surgery | Admitting: Orthopedic Surgery

## 2010-09-22 DIAGNOSIS — Z79899 Other long term (current) drug therapy: Secondary | ICD-10-CM | POA: Insufficient documentation

## 2010-09-22 DIAGNOSIS — G5600 Carpal tunnel syndrome, unspecified upper limb: Secondary | ICD-10-CM | POA: Insufficient documentation

## 2010-09-22 DIAGNOSIS — I10 Essential (primary) hypertension: Secondary | ICD-10-CM | POA: Insufficient documentation

## 2010-09-22 DIAGNOSIS — I2510 Atherosclerotic heart disease of native coronary artery without angina pectoris: Secondary | ICD-10-CM | POA: Insufficient documentation

## 2010-09-22 DIAGNOSIS — Z8673 Personal history of transient ischemic attack (TIA), and cerebral infarction without residual deficits: Secondary | ICD-10-CM | POA: Insufficient documentation

## 2010-09-22 DIAGNOSIS — Z01812 Encounter for preprocedural laboratory examination: Secondary | ICD-10-CM | POA: Insufficient documentation

## 2010-09-28 NOTE — Op Note (Signed)
NAMEJIA, [name]            ACCOUNT NO.:  [id number]  MEDICAL RECORD NO.:  [id number]  LOCATION:                                 FACILITY:  PHYSICIAN:  [name], M.D. DATE OF BIRTH:  08-Sep-1926  DATE OF PROCEDURE:  09/22/2010 DATE OF DISCHARGE:                              OPERATIVE REPORT   PREOPERATIVE DIAGNOSIS:  Chronic entrapment neuropathy, median nerve left carpal tunnel.  POSTOPERATIVE DIAGNOSIS:  Chronic entrapment neuropathy, median nerve left carpal tunnel.  OPERATIONS:  Release of left transverse carpal ligament.  OPERATIONS:  Carly Fitch. Athleen Feltner, MD  ASSISTANT:  Carly Reeks. Dasnoit, PA-C.  Schroeder:  General by LMA.  SUPERVISING ANESTHESIOLOGIST:  Carly Person, MD  INDICATIONS:  [name] is an 77 year old right-hand dominant woman referred through the courtesy of Dr. Merlene Schroeder for evaluation and management of hand pain and numbness.  In February 2012, [name] was referred to see [name] for electrodiagnostic studies.  These were completed and revealed very significant carpal tunnel syndrome.  Due to failed respond to nonoperative measures, [name] is referred for release of [name] left transverse carpal ligament at this time.  Preoperatively, [name] was reminded of the potential risks and benefits of surgery.  Questions regarding the anticipated procedure invited and answered in detail.  Preoperatively, [name] was interviewed by [name].  Got the spectrum of anesthetic choices from local Schroeder and sedation through general by LMA.  General by LMA was recommended and accepted.  PROCEDURE:  [name] was brought to room 1 of the [facility], placed in supine position on the operating table and under Dr. Burnett Corrente direct supervision, general Schroeder by LMA technique induced. [name] left arm was prepped with Betadine soap solution, sterilely draped. A pneumatic tourniquet was applied to proximal right brachium and  set at 250 mmHg.  Following routine surgical time-out, the left arm was exsanguinated with an Esmarch bandage, and an arterial tourniquet was inflated to 250 mmHg.  The procedure commenced with a short incision in the line of the ring finger of the palm.  Subcutaneous tissues were carefully divided revealing the palmar fascia.  The fascia was split longitudinally to reveal the common sensory branch of the median nerve and the superficial palmar arch.  [name] had a horseshoe-type muscle extending across the transcarpal ligament.  This was carefully teased apart with tenotomy scissors looking for an aberrant motor branch.  None was identified.  Once ligament was fully visible, the ligament was released sequentially with scissors along its ulnar border.  The median nerve was identified and ultimately, the motor branch identified distal. The wound was inspected for bleeding points, which were then electrocauterized with bipolar current.  The volar forearm was inspected with a Sewall retractor.  No masses or other predicaments were noted. The ulnar bursa was quite fibrotic.  The wound was then repaired with intradermal 3-0 Prolene suture.  A compressive dressing was applied with a volar plaster splint maintaining the wrist in 10 degrees of dorsiflexion.  For aftercare, [name] was provided prescription for Vicodin 5 mg 1 p.o. q.4-6 h. p.r.n. pain 20 tablets without refill.  We will see [name] back for followup in  a week for dressing change, suture removal, and advancement to an excise program.     Carly Fitch. Carlise Stofer, M.D.     RVS/MEDQ  D:  09/22/2010  T:  09/22/2010  Job:  [id number]  Electronically Signed by Josephine Igo M.D. on 09/28/2010 12:03:20 PM

## 2010-12-19 LAB — BASIC METABOLIC PANEL
BUN: 19
CO2: 27
Calcium: 9.3
Creatinine, Ser: 1.04
GFR calc Af Amer: >60

## 2011-02-12 ENCOUNTER — Other Ambulatory Visit (INDEPENDENT_AMBULATORY_CARE_PROVIDER_SITE_OTHER): Payer: Self-pay | Admitting: General Surgery

## 2011-02-12 DIAGNOSIS — Z1231 Encounter for screening mammogram for malignant neoplasm of breast: Secondary | ICD-10-CM

## 2011-03-08 ENCOUNTER — Ambulatory Visit
Admission: RE | Admit: 2011-03-08 | Discharge: 2011-03-08 | Disposition: A | Discharge status: Home / Self Care | Payer: Medicare Other | Source: Ambulatory Visit | Attending: General Surgery | Admitting: General Surgery

## 2011-03-08 DIAGNOSIS — Z1231 Encounter for screening mammogram for malignant neoplasm of breast: Secondary | ICD-10-CM

## 2011-03-13 ENCOUNTER — Ambulatory Visit: Payer: Medicare Other | Attending: Orthopedic Surgery | Admitting: Physical Therapy

## 2011-03-13 DIAGNOSIS — IMO0001 Reserved for inherently not codable concepts without codable children: Secondary | ICD-10-CM | POA: Insufficient documentation

## 2011-03-13 DIAGNOSIS — M25619 Stiffness of unspecified shoulder, not elsewhere classified: Secondary | ICD-10-CM | POA: Insufficient documentation

## 2011-03-13 DIAGNOSIS — M25519 Pain in unspecified shoulder: Secondary | ICD-10-CM | POA: Insufficient documentation

## 2011-03-14 ENCOUNTER — Other Ambulatory Visit: Payer: Self-pay | Admitting: Geriatric Medicine

## 2011-03-14 DIAGNOSIS — R911 Solitary pulmonary nodule: Secondary | ICD-10-CM

## 2011-04-03 ENCOUNTER — Encounter (INDEPENDENT_AMBULATORY_CARE_PROVIDER_SITE_OTHER): Payer: Self-pay | Admitting: General Surgery

## 2011-04-03 ENCOUNTER — Ambulatory Visit (INDEPENDENT_AMBULATORY_CARE_PROVIDER_SITE_OTHER): Payer: Medicare Other | Admitting: General Surgery

## 2011-04-03 VITALS — BP 122/58 | HR 60 | Temp 98.0°F | Resp 18 | Ht 64.0 in | Wt 156.4 lb

## 2011-04-03 DIAGNOSIS — C50912 Malignant neoplasm of unspecified site of left female breast: Secondary | ICD-10-CM | POA: Insufficient documentation

## 2011-04-03 DIAGNOSIS — C50919 Malignant neoplasm of unspecified site of unspecified female breast: Secondary | ICD-10-CM

## 2011-04-03 NOTE — Progress Notes (Signed)
Subjective:     Patient ID: [name], female   DOB: 1926/11/12, 78 y.o.   MRN: [id number]  HPI The patient is an 78 year old white female who is now 17 years out from a left breast lumpectomy and axillary node dissection for a T1 N0 left breast cancer. Her only complaint today is of some generalized aches and pains which is normal for her. She denies any chest wall or breast pain. She denies any discharge from her nipple. Her last mammogram performed recently and was negative for any malignancy.  Review of Systems  Constitutional: Negative.   HENT: Positive for neck stiffness.   Eyes: Negative.   Respiratory: Negative.   Cardiovascular: Negative.   Gastrointestinal: Negative.   Genitourinary: Negative.   Musculoskeletal: Positive for arthralgias.  Skin: Negative.   Neurological: Negative.   Hematological: Negative.   Psychiatric/Behavioral: Negative.        Objective:   Physical Exam  Constitutional: She is oriented to person, place, and time. She appears well-developed and well-nourished.  HENT:  Head: Normocephalic and atraumatic.  Eyes: Conjunctivae and EOM are normal. Pupils are equal, round, and reactive to light.  Neck: Normal range of motion. Neck supple.  Cardiovascular: Normal rate, regular rhythm and normal heart sounds.   Pulmonary/Chest: Effort normal and breath sounds normal.       The patient has a little nodularity at her lumpectomy incision which is stable. No other palpable mass in either breast. No axillary subclavicular cervical lymphadenopathy.  Abdominal: Soft. Bowel sounds are normal. She exhibits no mass. There is no tenderness.  Musculoskeletal: Normal range of motion.  Lymphadenopathy:    She has no cervical adenopathy.  Neurological: She is alert and oriented to person, place, and time.  Skin: Skin is warm and dry.  Psychiatric: She has a normal mood and affect. Her behavior is normal.       Assessment:     17 years status post left  lumpectomy and axillary node dissection    Plan:     At this point I have encouraged her to continue regular self exams. We will plan to see her back in one year after her next mammogram

## 2011-04-03 NOTE — Patient Instructions (Signed)
Continue regular self exams  

## 2011-04-18 ENCOUNTER — Ambulatory Visit
Admission: RE | Admit: 2011-04-18 | Discharge: 2011-04-18 | Disposition: A | Discharge status: Home / Self Care | Payer: Medicare Other | Source: Ambulatory Visit | Attending: Geriatric Medicine | Admitting: Geriatric Medicine

## 2011-04-18 DIAGNOSIS — R911 Solitary pulmonary nodule: Secondary | ICD-10-CM

## 2011-04-18 MED ORDER — IOHEXOL 300 MG/ML  SOLN
75.0000 mL | Freq: Once | INTRAMUSCULAR | Status: AC | PRN
  Administered 2011-04-18: 75 mL via INTRAVENOUS

## 2011-10-19 ENCOUNTER — Other Ambulatory Visit: Payer: Self-pay | Admitting: Geriatric Medicine

## 2011-10-19 DIAGNOSIS — D381 Neoplasm of uncertain behavior of trachea, bronchus and lung: Secondary | ICD-10-CM

## 2011-10-22 ENCOUNTER — Ambulatory Visit
Admission: RE | Admit: 2011-10-22 | Discharge: 2011-10-22 | Disposition: A | Discharge status: Home / Self Care | Payer: Medicare Other | Source: Ambulatory Visit | Attending: Geriatric Medicine | Admitting: Geriatric Medicine

## 2011-10-22 DIAGNOSIS — D381 Neoplasm of uncertain behavior of trachea, bronchus and lung: Secondary | ICD-10-CM

## 2011-10-22 MED ORDER — IOHEXOL 300 MG/ML  SOLN
75.0000 mL | Freq: Once | INTRAMUSCULAR | Status: AC | PRN
  Administered 2011-10-22: 75 mL via INTRAVENOUS

## 2012-02-07 ENCOUNTER — Other Ambulatory Visit (INDEPENDENT_AMBULATORY_CARE_PROVIDER_SITE_OTHER): Payer: Self-pay | Admitting: General Surgery

## 2012-02-07 ENCOUNTER — Other Ambulatory Visit: Payer: Self-pay | Admitting: Geriatric Medicine

## 2012-02-07 DIAGNOSIS — Z1231 Encounter for screening mammogram for malignant neoplasm of breast: Secondary | ICD-10-CM

## 2012-03-21 ENCOUNTER — Ambulatory Visit
Admission: RE | Admit: 2012-03-21 | Discharge: 2012-03-21 | Disposition: A | Discharge status: Home / Self Care | Payer: Medicare Other | Source: Ambulatory Visit | Attending: Geriatric Medicine | Admitting: Geriatric Medicine

## 2012-03-21 DIAGNOSIS — Z1231 Encounter for screening mammogram for malignant neoplasm of breast: Secondary | ICD-10-CM

## 2013-01-06 ENCOUNTER — Encounter: Payer: Self-pay | Admitting: Podiatry

## 2013-01-06 ENCOUNTER — Ambulatory Visit (INDEPENDENT_AMBULATORY_CARE_PROVIDER_SITE_OTHER): Payer: Medicare Other | Admitting: Podiatry

## 2013-01-06 VITALS — BP 136/70 | HR 71 | Resp 16

## 2013-01-06 DIAGNOSIS — L02619 Cutaneous abscess of unspecified foot: Secondary | ICD-10-CM

## 2013-01-06 MED ORDER — CEPHALEXIN 500 MG PO CAPS: 500.0000 mg | ORAL_CAPSULE | Freq: Three times a day (TID) | ORAL | Status: DC

## 2013-01-06 NOTE — Progress Notes (Signed)
°  Subjective:    Patient ID: [name], female    DOB: 29-Oct-1926, 79 y.o.   MRN: [id number]  HPI 1 week and a half the big toenail on the right foot and i trimmed and sore and no draining and been using peroxide and tender and sore   Review of Systems     Objective:   Physical Exam        Assessment & Plan:

## 2013-01-06 NOTE — Patient Instructions (Signed)
Start soaking twice daily in Epsom salts and water.  About a cup of salts to one quart of water.  Continue to soak until redness in toe has resolved. Cover with a band aid during the day only and leave open at night.  Start taking your antibiotic. Take one three times daily.

## 2013-01-06 NOTE — Progress Notes (Signed)
Emagene presents with a cc of painful hallux nail right.  States that she may have accidentally cut it when trimming the nails.  Has been using peroxide and triple anbx ointment. Denies fever chill nausea and vomiting.  O: Vitals are stable and she is alert and oriented. I have reviewed PMH, meds and allergies. Vascular eval. Demonstrates strong palpable pulses b/l. CFT is immediate. Neurological sensorium is intact and DTR are brisk and symmetrical.  Muscle strength is strong and symmetrical. Orthopedic eval demonstrates all joints distal to the ankle are free and without crepitation. Cutaneous eval. Demonstrates erythema and cellulitis with granulation tissue to the tibial border of the rt hallux.  A:  Paronychia, abscess hallux right foot.  P:  Start soaks in Epsom salts twice daily. Cover with bandaid.  Start Keflex 500mg  Tid. Follow up with me in two weeks.

## 2013-01-20 ENCOUNTER — Ambulatory Visit: Payer: Medicare Other | Admitting: Podiatry

## 2013-02-17 ENCOUNTER — Encounter: Payer: Self-pay | Admitting: Podiatry

## 2013-02-17 ENCOUNTER — Ambulatory Visit: Payer: Medicare Other | Admitting: Podiatry

## 2013-02-17 ENCOUNTER — Ambulatory Visit (INDEPENDENT_AMBULATORY_CARE_PROVIDER_SITE_OTHER): Payer: Medicare Other | Admitting: Podiatry

## 2013-02-17 VITALS — BP 172/80 | HR 62 | Resp 12

## 2013-02-17 DIAGNOSIS — L600 Ingrowing nail: Secondary | ICD-10-CM

## 2013-02-17 NOTE — Progress Notes (Signed)
[name] presents today for followup of her paronychia she states it seems to be healed up she says but the toenail is still sore.   Objective: Pulses remain palpable right lower extremity. Hallux nail right does have distal onychomycosis and nail dystrophy.  Assessment: Onychodystrophy with onychomycosis.  Plan: Debridement of nail.

## 2013-02-24 ENCOUNTER — Other Ambulatory Visit: Payer: Self-pay

## 2013-02-24 DIAGNOSIS — Z9889 Other specified postprocedural states: Secondary | ICD-10-CM

## 2013-02-24 DIAGNOSIS — Z853 Personal history of malignant neoplasm of breast: Secondary | ICD-10-CM

## 2013-02-24 DIAGNOSIS — Z1231 Encounter for screening mammogram for malignant neoplasm of breast: Secondary | ICD-10-CM

## 2013-03-17 ENCOUNTER — Ambulatory Visit: Payer: Medicare Other | Admitting: Podiatry

## 2013-04-02 ENCOUNTER — Ambulatory Visit: Payer: Medicare Other

## 2013-04-17 ENCOUNTER — Ambulatory Visit: Payer: Medicare Other

## 2013-04-21 ENCOUNTER — Ambulatory Visit (INDEPENDENT_AMBULATORY_CARE_PROVIDER_SITE_OTHER): Payer: Medicare Other | Admitting: Podiatry

## 2013-04-21 ENCOUNTER — Other Ambulatory Visit: Payer: Self-pay | Admitting: Orthopedic Surgery

## 2013-04-21 ENCOUNTER — Encounter: Payer: Self-pay | Admitting: Podiatry

## 2013-04-21 VITALS — BP 143/86 | HR 61 | Resp 18

## 2013-04-21 DIAGNOSIS — L600 Ingrowing nail: Secondary | ICD-10-CM

## 2013-04-21 NOTE — Progress Notes (Signed)
Right great toenail that he has worked on is still sore, i dont think its healed right.  Objective: Vital signs are stable she is alert and oriented x3. Pulses are palpable. She is a sharp incurvated nail margin along the tibial border of the hallux right.  Objective: Vital signs are stable she is alert and oriented x3. Schacher rated nail margin hallux right.  Plan: Discussed etiology pathology conservative therapies performed a slant back today and I will followup with her on an as-needed basis.

## 2013-04-27 ENCOUNTER — Encounter (HOSPITAL_BASED_OUTPATIENT_CLINIC_OR_DEPARTMENT_OTHER): Payer: Self-pay | Admitting: *Deleted

## 2013-04-27 NOTE — Progress Notes (Signed)
Pt will come by for ekg-bmet-

## 2013-04-28 ENCOUNTER — Encounter (HOSPITAL_BASED_OUTPATIENT_CLINIC_OR_DEPARTMENT_OTHER)
Admission: RE | Admit: 2013-04-28 | Discharge: 2013-04-28 | Disposition: A | Discharge status: Home / Self Care | Payer: Medicare Other | Source: Ambulatory Visit | Attending: Orthopedic Surgery | Admitting: Orthopedic Surgery

## 2013-04-28 LAB — BASIC METABOLIC PANEL
BUN: 24 mg/dL — ABNORMAL HIGH (ref 6–23)
CALCIUM: 8.8 mg/dL (ref 8.4–10.5)
CHLORIDE: 100 meq/L (ref 96–112)
CO2: 23 mEq/L (ref 19–32)
Creatinine, Ser: 1.1 mg/dL (ref 0.50–1.10)
GFR calc Af Amer: 52 mL/min — ABNORMAL LOW (ref >90)
GFR calc non Af Amer: 45 mL/min — ABNORMAL LOW (ref >90)
GLUCOSE: 103 mg/dL — AB (ref 70–99)
Potassium: 4.5 mEq/L (ref 3.7–5.3)
SODIUM: 137 meq/L (ref 137–147)

## 2013-04-28 NOTE — Progress Notes (Signed)
EKG reviewed by Dr Ola Spurr. Pt ok for surgery.

## 2013-04-29 NOTE — H&P (Signed)
[name] is an 80 y.o. female.   Chief Complaint: c/o chronic and progressive numbness and tingling of the right hand HPI: [name] returns for follow up evaluation of her hands. Her left hand has done very well following release of her left transverse carpal ligament on 09-22-10. She now requests similar attention to the right side. When she had her electrodiagnostic studies performed by Dr. Brien Few in 2012, she was noted to have severe bilateral carpal tunnel syndrome. She has advanced right thumb CMC arthrosis.     Past Medical History  Diagnosis Date  . Hypertension   . Hyperlipidemia   . Edema leg   . Arthritis   . Thyroid disease   . Back pain   . Cancer     breast cancer, left   . Hypothyroidism   . GERD (gastroesophageal reflux disease)   . Wears hearing aid     right    Past Surgical History  Procedure Laterality Date  . Tonsillectomy    . [place] surgery  2009    rt shoulder scope  . Breast surgery  1996    lt lumpectomy-ca  . Abdominal hysterectomy    . Eye surgery      both cataracts  . Hernia repair      rt ing  . Ercp  2007  . Colonoscopy    . Carpal tunnel release  2012    left    History reviewed. No pertinent family history. Social History:  reports that she has never smoked. She does not have any smokeless tobacco history on file. She reports that she does not drink alcohol. Her drug history is not on file.  Allergies: No Known Allergies  No prescriptions prior to admission    Results for orders placed during the hospital encounter of 04/30/13 (from the past 48 hour(s))  BASIC METABOLIC PANEL     Status: Abnormal   Collection Time    04/28/13  2:30 PM      Result Value Range   Sodium 137  137 - 147 mEq/L   Potassium 4.5  3.7 - 5.3 mEq/L   Chloride 100  96 - 112 mEq/L   CO2 23  19 - 32 mEq/L   Glucose, Bld 103 (*) 70 - 99 mg/dL   BUN 24 (*) 6 - 23 mg/dL   Creatinine, Ser 1.10  0.50 - 1.10 mg/dL   Calcium 8.8  8.4 - 10.5  mg/dL   GFR calc non Af Amer 45 (*) >90 mL/min   GFR calc Af Amer 52 (*) >90 mL/min   Comment: (NOTE)     The eGFR has been calculated using the CKD EPI equation.     This calculation has not been validated in all clinical situations.     eGFR's persistently <90 mL/min signify possible Chronic Kidney     Disease.    No results found.   Pertinent items are noted in HPI.  Height _0  (1.626 m), weight 66.225 kg (146 lb).  General appearance: alert Head: Normocephalic, without obvious abnormality Neck: supple, symmetrical, trachea midline Resp: clear to auscultation bilaterally Cardio: regular rate and rhythm GI: normal findings: bowel sounds normal Extremities: . She is ambulatory with a walker due to spinal stenosis. She has significant Heberden's and Bouchard's nodes and shouldering of her thumb CMC joints bilaterally. She is very pleased with the results of her left carpal tunnel release with recovery of normal sensibility and recovery of her thenar musculature.On the right she  has mild thenar atrophy. She has numbness in her median innervated fingers. Her provocative testing is positive.    Pulses: 2+ and symmetric Skin: normal Neurologic: Grossly normal    Assessment/Plan Impression: Right CTS  Plan: To the OR for right CTR.The procedure, risks,benefits and post-op course were discussed with the patient at length and they were in agreement with the plan.  DASNOIT,Vincient Vanaman J 04/29/2013, 12:38 PM   H&P documentation: 04/30/2013  -History and Physical Reviewed  -Patient has been re-examined  -No change in the plan of care  Cammie Sickle, MD

## 2013-04-30 ENCOUNTER — Encounter (HOSPITAL_BASED_OUTPATIENT_CLINIC_OR_DEPARTMENT_OTHER)
Admission: RE | Disposition: A | Discharge status: Home / Self Care | Payer: Self-pay | Source: Ambulatory Visit | Attending: Orthopedic Surgery

## 2013-04-30 ENCOUNTER — Encounter (HOSPITAL_BASED_OUTPATIENT_CLINIC_OR_DEPARTMENT_OTHER): Payer: Medicare Other | Admitting: Certified Registered"

## 2013-04-30 ENCOUNTER — Encounter (HOSPITAL_BASED_OUTPATIENT_CLINIC_OR_DEPARTMENT_OTHER): Payer: Self-pay | Admitting: Certified Registered"

## 2013-04-30 ENCOUNTER — Ambulatory Visit (HOSPITAL_BASED_OUTPATIENT_CLINIC_OR_DEPARTMENT_OTHER)
Admission: RE | Admit: 2013-04-30 | Discharge: 2013-04-30 | Disposition: A | Discharge status: Home / Self Care | Payer: Medicare Other | Source: Ambulatory Visit | Attending: Orthopedic Surgery | Admitting: Orthopedic Surgery

## 2013-04-30 ENCOUNTER — Ambulatory Visit (HOSPITAL_BASED_OUTPATIENT_CLINIC_OR_DEPARTMENT_OTHER): Payer: Medicare Other | Admitting: Certified Registered"

## 2013-04-30 DIAGNOSIS — K219 Gastro-esophageal reflux disease without esophagitis: Secondary | ICD-10-CM | POA: Insufficient documentation

## 2013-04-30 DIAGNOSIS — G5680 Other specified mononeuropathies of unspecified upper limb: Secondary | ICD-10-CM | POA: Insufficient documentation

## 2013-04-30 DIAGNOSIS — E785 Hyperlipidemia, unspecified: Secondary | ICD-10-CM | POA: Insufficient documentation

## 2013-04-30 DIAGNOSIS — I10 Essential (primary) hypertension: Secondary | ICD-10-CM | POA: Insufficient documentation

## 2013-04-30 DIAGNOSIS — M19049 Primary osteoarthritis, unspecified hand: Secondary | ICD-10-CM | POA: Insufficient documentation

## 2013-04-30 DIAGNOSIS — Z9889 Other specified postprocedural states: Secondary | ICD-10-CM | POA: Insufficient documentation

## 2013-04-30 DIAGNOSIS — Z9089 Acquired absence of other organs: Secondary | ICD-10-CM | POA: Insufficient documentation

## 2013-04-30 DIAGNOSIS — E039 Hypothyroidism, unspecified: Secondary | ICD-10-CM | POA: Insufficient documentation

## 2013-04-30 DIAGNOSIS — G5600 Carpal tunnel syndrome, unspecified upper limb: Secondary | ICD-10-CM | POA: Insufficient documentation

## 2013-04-30 HISTORY — PX: CARPAL TUNNEL RELEASE: SHX101

## 2013-04-30 HISTORY — DX: Presence of external hearing-aid: Z97.4

## 2013-04-30 HISTORY — DX: Hypothyroidism, unspecified: E03.9

## 2013-04-30 LAB — POCT HEMOGLOBIN-HEMACUE: Hemoglobin: 11 g/dL — ABNORMAL LOW (ref 12.0–15.0)

## 2013-04-30 SURGERY — CARPAL TUNNEL RELEASE
Anesthesia: General | Site: Wrist | Laterality: Right

## 2013-04-30 MED ORDER — ONDANSETRON HCL 4 MG/2ML IJ SOLN
INTRAMUSCULAR | Status: DC | PRN
  Administered 2013-04-30: 4 mg via INTRAVENOUS

## 2013-04-30 MED ORDER — LIDOCAINE HCL (CARDIAC) 20 MG/ML IV SOLN
INTRAVENOUS | Status: DC | PRN
  Administered 2013-04-30: 30 mg via INTRAVENOUS

## 2013-04-30 MED ORDER — FENTANYL CITRATE 0.05 MG/ML IJ SOLN
INTRAMUSCULAR | Status: AC
  Filled 2013-04-30: qty 4

## 2013-04-30 MED ORDER — FENTANYL CITRATE 0.05 MG/ML IJ SOLN: 50.0000 ug | INTRAMUSCULAR | Status: DC | PRN

## 2013-04-30 MED ORDER — METHYLPREDNISOLONE ACETATE 40 MG/ML IJ SUSP
INTRAMUSCULAR | Status: AC
  Filled 2013-04-30: qty 1

## 2013-04-30 MED ORDER — LACTATED RINGERS IV SOLN
INTRAVENOUS | Status: DC
  Administered 2013-04-30: 08:00:00 via INTRAVENOUS

## 2013-04-30 MED ORDER — LIDOCAINE HCL 2 % IJ SOLN
INTRAMUSCULAR | Status: AC
  Filled 2013-04-30: qty 20

## 2013-04-30 MED ORDER — MIDAZOLAM HCL 2 MG/2ML IJ SOLN: 1.0000 mg | INTRAMUSCULAR | Status: DC | PRN

## 2013-04-30 MED ORDER — PROPOFOL 10 MG/ML IV BOLUS
INTRAVENOUS | Status: DC | PRN
  Administered 2013-04-30: 30 mg via INTRAVENOUS
  Administered 2013-04-30 (×2): 10 mg via INTRAVENOUS

## 2013-04-30 MED ORDER — ACETAMINOPHEN-CODEINE #3 300-30 MG PO TABS: 1.0000 | ORAL_TABLET | ORAL | Status: DC | PRN

## 2013-04-30 MED ORDER — CHLORHEXIDINE GLUCONATE 4 % EX LIQD
60.0000 mL | Freq: Once | CUTANEOUS | Status: AC
  Administered 2013-04-30: 4 via TOPICAL

## 2013-04-30 MED ORDER — LIDOCAINE HCL 2 % IJ SOLN
INTRAMUSCULAR | Status: DC | PRN
  Administered 2013-04-30: 4.5 mL

## 2013-04-30 MED ORDER — FENTANYL CITRATE 0.05 MG/ML IJ SOLN: 25.0000 ug | INTRAMUSCULAR | Status: DC | PRN

## 2013-04-30 SURGICAL SUPPLY — 42 items
BANDAGE ADH SHEER 1  50/CT (GAUZE/BANDAGES/DRESSINGS) IMPLANT
BANDAGE ELASTIC 3 VELCRO ST LF (GAUZE/BANDAGES/DRESSINGS) ×2 IMPLANT
BLADE SURG 15 STRL LF DISP TIS (BLADE) ×1 IMPLANT
BLADE SURG 15 STRL SS (BLADE) ×2
BNDG CMPR 9X4 STRL LF SNTH (GAUZE/BANDAGES/DRESSINGS) ×1
BNDG COHESIVE 3X5 TAN STRL LF (GAUZE/BANDAGES/DRESSINGS) ×1 IMPLANT
BNDG ESMARK 4X9 LF (GAUZE/BANDAGES/DRESSINGS) ×1 IMPLANT
BRUSH SCRUB EZ PLAIN DRY (MISCELLANEOUS) ×2 IMPLANT
CORDS BIPOLAR (ELECTRODE) ×1 IMPLANT
COVER MAYO STAND STRL (DRAPES) ×2 IMPLANT
COVER TABLE BACK 60X90 (DRAPES) ×2 IMPLANT
CUFF TOURNIQUET SINGLE 18IN (TOURNIQUET CUFF) ×1 IMPLANT
DECANTER SPIKE VIAL GLASS SM (MISCELLANEOUS) IMPLANT
DRAPE EXTREMITY T 121X128X90 (DRAPE) ×2 IMPLANT
DRAPE SURG 17X23 STRL (DRAPES) ×2 IMPLANT
GLOVE BIOGEL M STRL SZ7.5 (GLOVE) ×1 IMPLANT
GLOVE BIOGEL PI IND STRL 7.0 (GLOVE) IMPLANT
GLOVE BIOGEL PI INDICATOR 7.0 (GLOVE) ×1
GLOVE ECLIPSE 6.5 STRL STRAW (GLOVE) ×1 IMPLANT
GLOVE EXAM NITRILE MD LF STRL (GLOVE) ×1 IMPLANT
GLOVE ORTHO TXT STRL SZ7.5 (GLOVE) ×2 IMPLANT
GOWN STRL REUS W/ TWL LRG LVL3 (GOWN DISPOSABLE) ×1 IMPLANT
GOWN STRL REUS W/ TWL XL LVL3 (GOWN DISPOSABLE) ×2 IMPLANT
GOWN STRL REUS W/TWL LRG LVL3 (GOWN DISPOSABLE) ×2
GOWN STRL REUS W/TWL XL LVL3 (GOWN DISPOSABLE) ×2
NEEDLE 27GAX1X1/2 (NEEDLE) ×1 IMPLANT
PACK BASIN DAY SURGERY FS (CUSTOM PROCEDURE TRAY) ×2 IMPLANT
PAD CAST 3X4 CTTN HI CHSV (CAST SUPPLIES) ×1 IMPLANT
PADDING CAST ABS 4INX4YD NS (CAST SUPPLIES) ×1
PADDING CAST ABS COTTON 4X4 ST (CAST SUPPLIES) ×1 IMPLANT
PADDING CAST COTTON 3X4 STRL (CAST SUPPLIES) ×2
SPLINT PLASTER CAST XFAST 3X15 (CAST SUPPLIES) ×5 IMPLANT
SPLINT PLASTER XTRA FASTSET 3X (CAST SUPPLIES) ×5
SPONGE GAUZE 4X4 12PLY (GAUZE/BANDAGES/DRESSINGS) ×2 IMPLANT
STOCKINETTE 4X48 STRL (DRAPES) ×2 IMPLANT
STRIP CLOSURE SKIN 1/2X4 (GAUZE/BANDAGES/DRESSINGS) ×2 IMPLANT
SUT PROLENE 3 0 PS 2 (SUTURE) ×2 IMPLANT
SYR 3ML 23GX1 SAFETY (SYRINGE) IMPLANT
SYR CONTROL 10ML LL (SYRINGE) ×1 IMPLANT
TOWEL OR 17X24 6PK STRL BLUE (TOWEL DISPOSABLE) ×2 IMPLANT
TRAY DSU PREP LF (CUSTOM PROCEDURE TRAY) ×2 IMPLANT
UNDERPAD 30X30 INCONTINENT (UNDERPADS AND DIAPERS) ×2 IMPLANT

## 2013-04-30 NOTE — Discharge Instructions (Signed)

## 2013-04-30 NOTE — Anesthesia Procedure Notes (Signed)
Procedure Name: MAC Date/Time: 04/30/2013 8:20 AM Performed by: Trinisha Paget Pre-anesthesia Checklist: Patient identified, Emergency Drugs available, Suction available, Patient being monitored and Timeout performed Patient Re-evaluated:Patient Re-evaluated prior to inductionOxygen Delivery Method: Simple face mask

## 2013-04-30 NOTE — Anesthesia Preprocedure Evaluation (Addendum)
Anesthesia Evaluation  Patient identified by MRN, date of birth, ID band Patient awake    Reviewed: Allergy & Precautions, H&P , NPO status , Patient's Chart, lab work & pertinent test results, reviewed documented beta blocker date and time   Airway Mallampati: II TM Distance: >3 FB     Dental no notable dental hx. (+) Teeth Intact and Dental Advisory Given   Pulmonary neg pulmonary ROS,  breath sounds clear to auscultation  Pulmonary exam normal       Cardiovascular hypertension, On Medications and On Home Beta Blockers Rhythm:Regular Rate:Normal     Neuro/Psych negative neurological ROS  negative psych ROS   GI/Hepatic Neg liver ROS, GERD-  Controlled and Medicated,  Endo/Other  Hypothyroidism   Renal/GU negative Renal ROS  negative genitourinary   Musculoskeletal   Abdominal   Peds  Hematology negative hematology ROS (+)   Anesthesia Other Findings   Reproductive/Obstetrics negative OB ROS                          Anesthesia Physical Anesthesia Plan  ASA: II  Anesthesia Plan: MAC   Post-op Pain Management:    Induction: Intravenous  Airway Management Planned:   Additional Equipment:   Intra-op Plan:   Post-operative Plan:   Informed Consent: I have reviewed the patients History and Physical, chart, labs and discussed the procedure including the risks, benefits and alternatives for the proposed anesthesia with the patient or authorized representative who has indicated his/her understanding and acceptance.   Dental advisory given  Plan Discussed with: CRNA  Anesthesia Plan Comments:        Anesthesia Quick Evaluation

## 2013-04-30 NOTE — Transfer of Care (Signed)
Immediate Anesthesia Transfer of Care Note  Patient: [name]  Procedure(s) Performed: Procedure(s): RIGHT CARPAL TUNNEL RELEASE (Right)  Patient Location: PACU  Anesthesia Type:MAC  Level of Consciousness: awake, oriented and patient cooperative  Airway & Oxygen Therapy: Patient Spontanous Breathing and Patient connected to face mask oxygen  Post-op Assessment: Report given to PACU RN and Post -op Vital signs reviewed and stable  Post vital signs: Reviewed and stable  Complications: No apparent anesthesia complications

## 2013-04-30 NOTE — Op Note (Signed)
[id number] 

## 2013-04-30 NOTE — Anesthesia Postprocedure Evaluation (Signed)
  Anesthesia Post-op Note  Patient: [name]  Procedure(s) Performed: Procedure(s): RIGHT CARPAL TUNNEL RELEASE (Right)  Patient Location: PACU  Anesthesia Type: MAC  Level of Consciousness: awake and alert   Airway and Oxygen Therapy: Patient Spontanous Breathing  Post-op Pain: mild  Post-op Assessment: Post-op Vital signs reviewed, Patient's Cardiovascular Status Stable and Respiratory Function Stable  Post-op Vital Signs: Reviewed  Filed Vitals:   04/30/13 0900  BP: 166/59  Pulse: 57  Temp:   Resp: 14    Complications: No apparent anesthesia complications

## 2013-04-30 NOTE — Brief Op Note (Signed)
04/30/2013  9:00 AM  PATIENT:  Tacy Learn  80 y.o. female  PRE-OPERATIVE DIAGNOSIS:  RIGHT CARPAL TUNNEL SYNDROME  POST-OPERATIVE DIAGNOSIS:  RIGHT CARPAL TUNNEL SYNDROME  PROCEDURE:  Procedure(s): RIGHT CARPAL TUNNEL RELEASE (Right)  SURGEON:  Surgeon(s) and Role:    * Cammie Sickle., MD - Primary  PHYSICIAN ASSISTANT:   ASSISTANTS: surgical tech  ANESTHESIA:   general  EBL:  Total I/O In: 400 [I.V.:400] Out: -   BLOOD ADMINISTERED:none  DRAINS: none   LOCAL MEDICATIONS USED:  XYLOCAINE   SPECIMEN:  No Specimen  DISPOSITION OF SPECIMEN:  N/A  COUNTS:  YES  TOURNIQUET:   Total Tourniquet Time Documented: Upper Arm (Right) - 11 minutes Total: Upper Arm (Right) - 11 minutes   DICTATION: .Other Dictation: Dictation Number [id number]  PLAN OF CARE: Discharge to home after PACU  PATIENT DISPOSITION:  PACU - hemodynamically stable.   Delay start of Pharmacological VTE agent (>24hrs) due to surgical blood loss or risk of bleeding: not applicable

## 2013-05-01 ENCOUNTER — Encounter (HOSPITAL_BASED_OUTPATIENT_CLINIC_OR_DEPARTMENT_OTHER): Payer: Self-pay | Admitting: Orthopedic Surgery

## 2013-05-01 NOTE — Op Note (Signed)
[name], CRANFIELD            ACCOUNT NO.:  [id number]  MEDICAL RECORD NO.:  [id number]  LOCATION:                                 FACILITY:  PHYSICIAN:  Youlanda Mighty. Wendelin Bradt, M.D. DATE OF BIRTH:  1927/02/11  DATE OF PROCEDURE:  04/30/2013 DATE OF DISCHARGE:  04/30/2013                              OPERATIVE REPORT   PREOPERATIVE DIAGNOSIS:  Entrapment neuropathy median nerve, right carpal tunnel.  POSTOPERATIVE DIAGNOSIS:  Entrapment neuropathy median nerve, right carpal tunnel.  OPERATION:  Release of right transverse carpal ligament.  OPERATING SURGEON:  Youlanda Mighty. Nora Rooke, MD.  ASSISTANT:  Surgical technician.  ANESTHESIA:  2% lidocaine field block and median nerve block, right wrist supplemented by IV sedation.  SUPERVISING ANESTHESIOLOGIST:  Dr. Ola Spurr.  INDICATIONS:  Jeanet Lupe is an 80 year old woman referred through the courtesy of Dr. __________ for management of chronic right hand numbness.  She is status post prior carpal tunnel release remotely on the left side.  She has background osteoarthritis with severe arthritis of the carpometacarpal joint of the thumb.  Due to her failure to respond to non operative measures, she is brought to the operating room at this time for release of right transverse carpal ligament.  PROCEDURE:  Isebella Upshur was brought to room 2 of the [facility] and placed supine position on the operating table.  In the holding area prior to surgery, Dr. Ola Spurr outlined anesthesia choices.  Monitored anesthesia care was recommended and accepted.  In room 2 after informed consent and Betadine prep, 2% lidocaine, total volume of 4.5 mL was infiltrated wrist level around the median nerve and in the path of the intended incision.  Light IV sedation was provided.  The right hand had been marked per protocol with a marking pen in the holding area, as the proper surgical site.  The right hand and arm were then  prepped with Betadine soap and solution, sterilely draped.  Following exsanguination right arm with Esmarch bandage, arterial tourniquet on proximal brachium was inflated to 250 mmHg due to mild systolic hypertension.  Following routine surgical time-out, procedure commenced with a 2 cm incision in the line of the ring finger and the palm.  Subcutaneous tissues were carefully divided the palmar fascia.  This was split longitudinally revealing common sensory branch of the median nerve and the superficial palmar arch.  The distal margin of the transverse carpal ligament was isolated and subsequently split with scissors, moving proximally.  A Penfield 4 elevator was passed through the carpal canal. The transverse carpal ligament was sequentially released along its ulnar border extending into the distal forearm.  This widely opened carpal canal.  No masses or other predicaments were noted.  Bleeding points along the margin of the released ligament were electrocauterized with bipolar current.  The volar forearm fascia was released subcutaneously and a __________ parotid retractor was used to visualize the peroneal space.  This was widely decompressed.  There were no mass __________.  The tenosynovium was quite opaque and thickened. The wound was then repaired with intradermal 3-0 Prolene.  A compressive dressing was applied with Steri-strips, sterile gauze, sterile Webril, and a volar plaster splint maintaining the wrist in 15  degrees of dorsiflexion.  For aftercare, Ms Sorey is provided prescription for Tylenol with Codeine No.3 one p.o. q.4-6 hours p.r.n. pain, 20 tablets without refill.  I will see Ms. Bechtold back for followup in the office in 1 week.     Youlanda Mighty Gladyce Mcray, M.D.     RVS/MEDQ  D:  04/30/2013  T:  04/30/2013  Job:  [id number]

## 2013-05-04 ENCOUNTER — Ambulatory Visit: Payer: Medicare Other

## 2013-05-12 ENCOUNTER — Ambulatory Visit: Payer: Medicare Other

## 2013-05-15 ENCOUNTER — Encounter (INDEPENDENT_AMBULATORY_CARE_PROVIDER_SITE_OTHER): Payer: Medicare Other | Admitting: General Surgery

## 2013-05-21 ENCOUNTER — Ambulatory Visit: Payer: Medicare Other

## 2013-05-26 ENCOUNTER — Encounter (INDEPENDENT_AMBULATORY_CARE_PROVIDER_SITE_OTHER): Payer: Medicare Other | Admitting: General Surgery

## 2013-06-02 ENCOUNTER — Ambulatory Visit
Admission: RE | Admit: 2013-06-02 | Discharge: 2013-06-02 | Disposition: A | Discharge status: Home / Self Care | Payer: Medicare Other | Source: Ambulatory Visit

## 2013-06-02 DIAGNOSIS — Z9889 Other specified postprocedural states: Secondary | ICD-10-CM

## 2013-06-02 DIAGNOSIS — Z1231 Encounter for screening mammogram for malignant neoplasm of breast: Secondary | ICD-10-CM

## 2013-06-02 DIAGNOSIS — Z853 Personal history of malignant neoplasm of breast: Secondary | ICD-10-CM

## 2013-06-08 ENCOUNTER — Ambulatory Visit (INDEPENDENT_AMBULATORY_CARE_PROVIDER_SITE_OTHER): Payer: Medicare Other | Admitting: General Surgery

## 2013-06-16 ENCOUNTER — Encounter: Payer: Self-pay | Admitting: Podiatry

## 2013-06-16 ENCOUNTER — Ambulatory Visit (INDEPENDENT_AMBULATORY_CARE_PROVIDER_SITE_OTHER): Payer: Medicare Other | Admitting: Podiatry

## 2013-06-16 VITALS — BP 152/82 | HR 66 | Resp 12

## 2013-06-16 DIAGNOSIS — L600 Ingrowing nail: Secondary | ICD-10-CM

## 2013-06-16 MED ORDER — NEOMYCIN-POLYMYXIN-HC 3.5-10000-1 OT SOLN: OTIC | Status: DC

## 2013-06-16 NOTE — Patient Instructions (Signed)

## 2013-06-16 NOTE — Progress Notes (Signed)
She presents today with a chief complaint of painful ingrown toenail to the tibial and fibular border of the hallux right. States that every time he did trimmed out it seems Wells Guiles become painful her once again. I would like to have this permanently fix at all possible.  Objective: Vital signs are stable she is alert and oriented x3 pulses are palpable right lower extremity. Capillary fill time to digits one through 5 of the right foot was noted to be immediate. Sharp incurvated nail margins along the tibial and fibular border of the hallux right exquisitely painful on palpation there is mild erythema associated with medial and distal medial aspect of the tibial border of the hallux right.  Assessment: Ingrown nail paronychia abscess hallux right.  Plan: Discussed etiology pathology conservative versus surgical therapies with her today. A bursectomy was performed to the tibial and fibular border of the hallux right. This was performed after 3 cc of a 50-50 mixture of Marcaine plain and lidocaine plain was injected in a digital block about the hallux right. He was then prepped and draped in is normal sterile fashion. Once exsanguinated the tourniquet was placed. The nail split from distal to proximal along the tibial and fibular border and avulsed from the toe exposing the bed and the matrix. 3 applications of phenol were applied to the matrix and nailbed 30 seconds each. It was then neutralized last alcohol. Silvadene cream Telfa pad and a dry sterile compressive dressing was applied. Patient left with both oral and written home-going instructions as well as a prescription for Cortisporin Otic. I will followup with her in one week after she's been soaking in Betadine and water twice daily I will see her with any questions or concerns that she may have to.

## 2013-06-19 ENCOUNTER — Other Ambulatory Visit: Payer: Self-pay | Admitting: Dermatology

## 2013-06-23 ENCOUNTER — Encounter: Payer: Self-pay | Admitting: Podiatry

## 2013-06-23 ENCOUNTER — Ambulatory Visit (INDEPENDENT_AMBULATORY_CARE_PROVIDER_SITE_OTHER): Payer: Medicare Other | Admitting: Podiatry

## 2013-06-23 VITALS — BP 130/82 | HR 66 | Resp 18

## 2013-06-23 DIAGNOSIS — Z9889 Other specified postprocedural states: Secondary | ICD-10-CM

## 2013-06-23 NOTE — Progress Notes (Signed)
She presents today for a followup of her matrixectomy hallux right tibial border she denies fever chills nausea vomiting muscle aches and pains. States she's been soaking in Betadine on a regular basis.  Objective: Vital signs are stable she is alert and oriented x3. There is no erythema edema cellulitis drainage or odor to either tibial or fibular border of the hallux right.  Assessment: Well-healing surgical toe hallux right.  Plan: Discontinue Betadine start with Epsom salts and water soaks twice daily. She will continue to apply her Cortisporin Otic to the margins twice daily. She will cover during the day with a Band-Aid and leave open at night. She will continue to soak until completely well i.e. no redness no pain and no drainage.

## 2013-06-29 ENCOUNTER — Ambulatory Visit (INDEPENDENT_AMBULATORY_CARE_PROVIDER_SITE_OTHER): Payer: Medicare Other | Admitting: General Surgery

## 2013-09-08 ENCOUNTER — Ambulatory Visit: Payer: Medicare Other | Attending: Orthopedic Surgery | Admitting: Physical Therapy

## 2013-09-08 DIAGNOSIS — IMO0001 Reserved for inherently not codable concepts without codable children: Secondary | ICD-10-CM | POA: Insufficient documentation

## 2013-09-08 DIAGNOSIS — M25519 Pain in unspecified shoulder: Secondary | ICD-10-CM | POA: Insufficient documentation

## 2013-09-08 DIAGNOSIS — M25619 Stiffness of unspecified shoulder, not elsewhere classified: Secondary | ICD-10-CM | POA: Insufficient documentation

## 2013-09-15 ENCOUNTER — Ambulatory Visit: Payer: Medicare Other | Admitting: Physical Therapy

## 2013-09-17 ENCOUNTER — Ambulatory Visit: Payer: Medicare Other | Admitting: Physical Therapy

## 2013-09-22 ENCOUNTER — Ambulatory Visit: Payer: Medicare Other | Admitting: Physical Therapy

## 2013-09-29 ENCOUNTER — Ambulatory Visit: Payer: Medicare Other | Attending: Orthopedic Surgery | Admitting: Physical Therapy

## 2013-09-29 DIAGNOSIS — IMO0001 Reserved for inherently not codable concepts without codable children: Secondary | ICD-10-CM | POA: Insufficient documentation

## 2013-09-29 DIAGNOSIS — M25519 Pain in unspecified shoulder: Secondary | ICD-10-CM | POA: Insufficient documentation

## 2013-09-29 DIAGNOSIS — M25619 Stiffness of unspecified shoulder, not elsewhere classified: Secondary | ICD-10-CM | POA: Diagnosis not present

## 2013-10-01 ENCOUNTER — Ambulatory Visit: Payer: Medicare Other | Admitting: Physical Therapy

## 2013-10-06 ENCOUNTER — Ambulatory Visit: Payer: Medicare Other | Admitting: Physical Therapy

## 2013-10-06 DIAGNOSIS — IMO0001 Reserved for inherently not codable concepts without codable children: Secondary | ICD-10-CM | POA: Diagnosis not present

## 2013-10-08 ENCOUNTER — Ambulatory Visit: Payer: Medicare Other | Admitting: Physical Therapy

## 2013-10-08 DIAGNOSIS — IMO0001 Reserved for inherently not codable concepts without codable children: Secondary | ICD-10-CM | POA: Diagnosis not present

## 2013-10-13 ENCOUNTER — Ambulatory Visit: Payer: Medicare Other | Admitting: Physical Therapy

## 2013-10-13 DIAGNOSIS — IMO0001 Reserved for inherently not codable concepts without codable children: Secondary | ICD-10-CM | POA: Diagnosis not present

## 2013-10-15 ENCOUNTER — Ambulatory Visit: Payer: Medicare Other | Admitting: Physical Therapy

## 2013-10-15 DIAGNOSIS — IMO0001 Reserved for inherently not codable concepts without codable children: Secondary | ICD-10-CM | POA: Diagnosis not present

## 2014-04-26 ENCOUNTER — Other Ambulatory Visit: Payer: Self-pay

## 2014-04-26 DIAGNOSIS — Z1231 Encounter for screening mammogram for malignant neoplasm of breast: Secondary | ICD-10-CM

## 2014-05-24 ENCOUNTER — Other Ambulatory Visit (HOSPITAL_COMMUNITY): Payer: Self-pay | Admitting: Geriatric Medicine

## 2014-05-24 ENCOUNTER — Ambulatory Visit (HOSPITAL_COMMUNITY): Payer: Medicare Other | Attending: Cardiovascular Disease | Admitting: Radiology

## 2014-05-24 DIAGNOSIS — I340 Nonrheumatic mitral (valve) insufficiency: Secondary | ICD-10-CM

## 2014-05-24 NOTE — Progress Notes (Signed)
Echocardiogram performed.  

## 2014-06-07 ENCOUNTER — Ambulatory Visit
Admission: RE | Admit: 2014-06-07 | Discharge: 2014-06-07 | Disposition: A | Discharge status: Home / Self Care | Payer: Medicare Other | Source: Ambulatory Visit

## 2014-06-07 DIAGNOSIS — Z1231 Encounter for screening mammogram for malignant neoplasm of breast: Secondary | ICD-10-CM

## 2014-10-28 ENCOUNTER — Other Ambulatory Visit: Payer: Self-pay | Admitting: Gastroenterology

## 2014-10-28 ENCOUNTER — Encounter (HOSPITAL_COMMUNITY): Payer: Self-pay | Admitting: *Deleted

## 2014-10-29 ENCOUNTER — Other Ambulatory Visit: Payer: Self-pay | Admitting: Gastroenterology

## 2014-11-01 ENCOUNTER — Ambulatory Visit (HOSPITAL_COMMUNITY): Payer: Medicare Other | Admitting: Anesthesiology

## 2014-11-01 ENCOUNTER — Encounter (HOSPITAL_COMMUNITY)
Admission: RE | Disposition: A | Discharge status: Home / Self Care | Payer: Self-pay | Source: Ambulatory Visit | Attending: Gastroenterology

## 2014-11-01 ENCOUNTER — Ambulatory Visit (HOSPITAL_COMMUNITY): Payer: Medicare Other

## 2014-11-01 ENCOUNTER — Ambulatory Visit (HOSPITAL_COMMUNITY)
Admission: RE | Admit: 2014-11-01 | Discharge: 2014-11-01 | Disposition: A | Discharge status: Home / Self Care | Payer: Medicare Other | Source: Ambulatory Visit | Attending: Gastroenterology | Admitting: Gastroenterology

## 2014-11-01 ENCOUNTER — Encounter (HOSPITAL_COMMUNITY): Payer: Self-pay | Admitting: Anesthesiology

## 2014-11-01 DIAGNOSIS — K219 Gastro-esophageal reflux disease without esophagitis: Secondary | ICD-10-CM | POA: Diagnosis not present

## 2014-11-01 DIAGNOSIS — R1310 Dysphagia, unspecified: Secondary | ICD-10-CM | POA: Insufficient documentation

## 2014-11-01 DIAGNOSIS — K222 Esophageal obstruction: Secondary | ICD-10-CM | POA: Diagnosis present

## 2014-11-01 DIAGNOSIS — K449 Diaphragmatic hernia without obstruction or gangrene: Secondary | ICD-10-CM | POA: Diagnosis not present

## 2014-11-01 HISTORY — PX: ESOPHAGOGASTRODUODENOSCOPY (EGD) WITH PROPOFOL: SHX5813

## 2014-11-01 SURGERY — ESOPHAGOGASTRODUODENOSCOPY (EGD) WITH PROPOFOL
Anesthesia: Monitor Anesthesia Care

## 2014-11-01 MED ORDER — PROPOFOL 10 MG/ML IV BOLUS
INTRAVENOUS | Status: AC
  Filled 2014-11-01: qty 20

## 2014-11-01 MED ORDER — BUTAMBEN-TETRACAINE-BENZOCAINE 2-2-14 % EX AERO
INHALATION_SPRAY | CUTANEOUS | Status: DC | PRN
  Administered 2014-11-01: 1 via TOPICAL

## 2014-11-01 MED ORDER — SODIUM CHLORIDE 0.9 % IV SOLN: INTRAVENOUS | Status: DC

## 2014-11-01 MED ORDER — LACTATED RINGERS IV SOLN
INTRAVENOUS | Status: DC
  Administered 2014-11-01: 14:00:00 via INTRAVENOUS
  Administered 2014-11-01: 1000 mL via INTRAVENOUS

## 2014-11-01 MED ORDER — PROPOFOL INFUSION 10 MG/ML OPTIME
INTRAVENOUS | Status: DC | PRN
  Administered 2014-11-01: 140 ug/kg/min via INTRAVENOUS

## 2014-11-01 SURGICAL SUPPLY — 15 items

## 2014-11-01 NOTE — Anesthesia Postprocedure Evaluation (Signed)
  Anesthesia Post-op Note  Patient: [name]  Procedure(s) Performed: Procedure(s) (LRB): ESOPHAGOGASTRODUODENOSCOPY (EGD) WITH PROPOFOL (N/A)  Patient Location: PACU  Anesthesia Type: MAC  Level of Consciousness: awake and alert   Airway and Oxygen Therapy: Patient Spontanous Breathing  Post-op Pain: mild  Post-op Assessment: Post-op Vital signs reviewed, Patient's Cardiovascular Status Stable, Respiratory Function Stable, Patent Airway and No signs of Nausea or vomiting  Last Vitals:  Filed Vitals:   11/01/14 1428  BP: 150/51  Pulse: 67  Temp: 36.8 C  Resp: 29    Post-op Vital Signs: stable   Complications: No apparent anesthesia complications

## 2014-11-01 NOTE — Transfer of Care (Signed)
Immediate Anesthesia Transfer of Care Note  Patient: [name]  Procedure(s) Performed: Procedure(s) with comments: ESOPHAGOGASTRODUODENOSCOPY (EGD) WITH PROPOFOL (N/A) - Dr Wynetta Emery also wants the pt to have a Fluroscopy.  Patient Location: PACU  Anesthesia Type:MAC  Level of Consciousness:  sedated, patient cooperative and responds to stimulation  Airway & Oxygen Therapy:Patient Spontanous Breathing and Patient connected to face mask oxgen  Post-op Assessment:  Report given to PACU RN and Post -op Vital signs reviewed and stable  Post vital signs:  Reviewed and stable  Last Vitals:  Filed Vitals:   11/01/14 1428  BP: 150/51  Pulse: 67  Temp: 36.8 C  Resp: 29    Complications: No apparent anesthesia complications

## 2014-11-01 NOTE — Op Note (Signed)
Problem: Esophageal dysphagia. Benign stricture at the esophagogastric junction dilated in November 2007. Chronic gastroesophageal reflux treated with proton pump inhibitor therapy.  Endoscopist: Earle Gell  Premedication: Propofol administered by anesthesia  Procedure: Diagnostic esophagogastroduodenoscopy with esophageal balloon dilation of the benign stricture at the esophagogastric junction The patient was placed in the left lateral decubitus position. The Pentax gastroscope was passed through the posterior hypopharynx into the proximal esophagus without difficulty. The hypopharynx, larynx, and vocal cords appeared normal.  Esophagoscopy: The proximal and mid segments of the esophageal mucosa appeared normal. The squamocolumnar junction was regular in appearance and noted at 35 cm from the incisor teeth. There was a benign stricture at the esophagogastric junction extending less than 1 cm in length. The distal esophageal mucosa appeared otherwise normal without signs of erosive esophagitis or Barrett's esophagus. Using the esophageal balloon dilator, the benign stricture at the esophagogastric junction was dilated from 15 mm to 18 mm without apparent complications.  Gastroscopy: There was a moderate sized hiatal hernia. Retroflexed view of the gastric cardia and fundus was normal. The gastric body, antrum, and pylorus appeared normal.  Duodenoscopy: The duodenal bulb and descending duodenum appeared normal  Assessment: Chronic gastroesophageal reflux associated with a hiatal hernia and complicated by a benign stricture at the esophagogastric junction dilated from 15 mm to 18 mm using the esophageal balloon dilator.  Recommendation: Continue proton pump inhibitor therapy. If the patient requires oral iron therapy, I recommend using iron elixir and not iron tablets. Oral iron tablets can cause esophageal mucosal injury if the tablet resolves in the distal esophagus at the level of her distal  esophageal stricture.

## 2014-11-01 NOTE — Anesthesia Preprocedure Evaluation (Addendum)
Anesthesia Evaluation  Patient identified by MRN, date of birth, ID band Patient awake    Reviewed: Allergy & Precautions, H&P , NPO status , Patient's Chart, lab work & pertinent test results, reviewed documented beta blocker date and time   Airway Mallampati: II  TM Distance: >3 FB     Dental no notable dental hx. (+) Teeth Intact, Dental Advisory Given   Pulmonary neg pulmonary ROS,  breath sounds clear to auscultation  Pulmonary exam normal       Cardiovascular hypertension, On Medications and On Home Beta Blockers + Valvular Problems/Murmurs (2016 echo - moderate AS, mild - mod AI, trivial MR.. EF preserved at 60%) AS, AI and MR Rhythm:Regular Rate:Normal     Neuro/Psych negative neurological ROS  negative psych ROS   GI/Hepatic Neg liver ROS, GERD-  Controlled and Medicated,  Endo/Other  Hypothyroidism   Renal/GU negative Renal ROS  negative genitourinary   Musculoskeletal   Abdominal   Peds  Hematology negative hematology ROS (+)   Anesthesia Other Findings   Reproductive/Obstetrics negative OB ROS                            Anesthesia Physical  Anesthesia Plan  ASA: II  Anesthesia Plan: MAC   Post-op Pain Management:    Induction: Intravenous  Airway Management Planned: Nasal Cannula  Additional Equipment:   Intra-op Plan:   Post-operative Plan:   Informed Consent: I have reviewed the patients History and Physical, chart, labs and discussed the procedure including the risks, benefits and alternatives for the proposed anesthesia with the patient or authorized representative who has indicated his/her understanding and acceptance.   Dental advisory given  Plan Discussed with: CRNA and Anesthesiologist  Anesthesia Plan Comments:         Anesthesia Quick Evaluation

## 2014-11-01 NOTE — H&P (Signed)
  Problem: Esophageal dysphagia on ferrous sulfate. 01/17/2006 esophagogastroduodenoscopy was performed with removal of a food impaction. 01/2006 repeat esophagogastroduodenoscopy was performed with dilation of a benign stricture at the esophagogastric junction.  History: The patient is an 81 year old female who has a benign stricture at the esophagogastric junction associated with a hiatal hernia. She takes proton pump inhibitor therapy before breakfast each morning. She last required esophageal stricture dilation in November 2007.  The patient is experiencing intermittent esophageal dysphagia without odynophagia. She is also experiencing early satiety and belching.  The patient is scheduled to undergo diagnostic esophagogastroduodenoscopy with possible esophageal stricture dilation.  Past medical history: Radioactive iodine therapy for hyperthyroidism. Secondary hypothyroidism. Hypertension. Proteinuria. Hypercholesterolemia. Microscopic hematuria. Osteoarthritis of the left knee. Benign stricture at the esophagogastric junction. Migraine headache syndrome. Severe lumbar spinal stenosis. Mild mitral valve regurgitation. Mild aortic valve regurgitation. Stable left lung nodule. Left breast cancer. Lumpectomy. Hysterectomy. Tonsillectomy. Right rotator cuff surgery. Cataract surgery. Right inguinal hernia repair. Left cataract surgery. Carpal tunnel release surgery.  Medication allergies: None  Exam: The patient is alert and lying comfortably on the endoscopy stretcher. Abdomen is soft and nontender to palpation. Lungs are clear to auscultation. Cardiac exam reveals a regular rhythm.  Plan: Proceed with diagnostic esophagogastroduodenoscopy with possible esophageal stricture dilation

## 2014-11-01 NOTE — Discharge Instructions (Signed)
Esophagogastroduodenoscopy Care After Refer to this sheet in the next few weeks. These instructions provide you with information on caring for yourself after your procedure. Your caregiver may also give you more specific instructions. Your treatment has been planned according to current medical practices, but problems sometimes occur. Call your caregiver if you have any problems or questions after your procedure.  HOME CARE INSTRUCTIONS  Do not eat or drink anything until the numbing medicine (local anesthetic) has worn off and your gag reflex has returned. You will know that the local anesthetic has worn off when you can swallow comfortably.  Do not drive for 24 hours after the procedure or as directed by your caregiver.  Only take medicines as directed by your caregiver. SEEK MEDICAL CARE IF:   You cannot stop coughing.  You are not urinating at all or less than usual. SEEK IMMEDIATE MEDICAL CARE IF:  You have difficulty swallowing.  You cannot eat or drink.  You have worsening throat or chest pain.  You have dizziness, lightheadedness, or you faint.  You have nausea or vomiting.  You have chills.  You have a fever.  You have severe abdominal pain.  You have black, tarry, or bloody stools. Document Released: 02/27/2012 Document Reviewed: 02/27/2012 [facility] Patient Information 2015 [place]. This information is not intended to replace advice given to you by your health care provider. Make sure you discuss any questions you have with your health care provider.

## 2014-11-02 ENCOUNTER — Encounter (HOSPITAL_COMMUNITY): Payer: Self-pay | Admitting: Gastroenterology

## 2015-05-05 ENCOUNTER — Other Ambulatory Visit: Payer: Self-pay

## 2015-05-05 DIAGNOSIS — Z1231 Encounter for screening mammogram for malignant neoplasm of breast: Secondary | ICD-10-CM

## 2015-06-08 ENCOUNTER — Ambulatory Visit
Admission: RE | Admit: 2015-06-08 | Discharge: 2015-06-08 | Disposition: A | Discharge status: Home / Self Care | Payer: Medicare Other | Source: Ambulatory Visit

## 2015-06-08 DIAGNOSIS — Z1231 Encounter for screening mammogram for malignant neoplasm of breast: Secondary | ICD-10-CM

## 2015-07-21 ENCOUNTER — Encounter: Payer: Self-pay | Admitting: Podiatry

## 2015-07-21 ENCOUNTER — Ambulatory Visit (INDEPENDENT_AMBULATORY_CARE_PROVIDER_SITE_OTHER): Payer: Medicare Other | Admitting: Podiatry

## 2015-07-21 DIAGNOSIS — M79676 Pain in unspecified toe(s): Secondary | ICD-10-CM | POA: Diagnosis not present

## 2015-07-21 DIAGNOSIS — B351 Tinea unguium: Secondary | ICD-10-CM

## 2015-07-21 NOTE — Progress Notes (Signed)
She presents today with chief complaint of painful elongated toenails.  Objective: Pulses are strongly palpable bilateral. Her toenails are thick yellow dystrophic onychomycotic and painful palpation.  Assessment: Pain in limb secondary to onychomycosis 1 through 5 bilateral.  Plan: Debridement of toenails 1 through 5 bilateral. Follow up with her as needed.

## 2016-04-19 ENCOUNTER — Telehealth: Payer: Self-pay | Admitting: Physical Therapy

## 2016-04-19 NOTE — Telephone Encounter (Signed)
04/19/16 patient does not want to do PT for her shoulder

## 2016-04-23 ENCOUNTER — Encounter: Payer: Self-pay | Admitting: Podiatry

## 2016-04-23 ENCOUNTER — Ambulatory Visit (INDEPENDENT_AMBULATORY_CARE_PROVIDER_SITE_OTHER): Payer: Medicare Other | Admitting: Podiatry

## 2016-04-23 DIAGNOSIS — M79676 Pain in unspecified toe(s): Secondary | ICD-10-CM | POA: Diagnosis not present

## 2016-04-23 DIAGNOSIS — B351 Tinea unguium: Secondary | ICD-10-CM | POA: Diagnosis not present

## 2016-04-23 NOTE — Progress Notes (Signed)
Subjective: 83 y.o. returns the office today for painful, elongated, thickened toenails which she cannot trim herself. Denies any redness or drainage around the nails. Denies any acute changes since last appointment and no new complaints today. Denies any systemic complaints such as fevers, chills, nausea, vomiting.   Objective: AAO 3, NAD DP/PT pulses palpable, CRT less than 3 seconds Nails hypertrophic, dystrophic, elongated, brittle, discolored 6. There is tenderness overlying the nails bilateral hallux, 4th and 5th toenails bilaterally. There is no surrounding erythema or drainage along the nail sites. No open lesions or pre-ulcerative lesions are identified. No other areas of tenderness bilateral lower extremities. No overlying edema, erythema, increased warmth. No pain with calf compression, swelling, warmth, erythema.  Assessment: Patient presents with symptomatic onychomycosis  Plan: -Treatment options including alternatives, risks, complications were discussed -Nails sharply debrided 6 without complication/bleeding. -Discussed daily foot inspection. If there are any changes, to call the office immediately.  -Follow-up in 3 months or sooner if any problems are to arise. In the meantime, encouraged to call the office with any questions, concerns, changes symptoms.  Celesta Gentile, DPM

## 2016-05-03 ENCOUNTER — Other Ambulatory Visit: Payer: Self-pay | Admitting: Geriatric Medicine

## 2016-05-03 DIAGNOSIS — Z1231 Encounter for screening mammogram for malignant neoplasm of breast: Secondary | ICD-10-CM

## 2016-05-25 ENCOUNTER — Other Ambulatory Visit: Payer: Self-pay | Admitting: Geriatric Medicine

## 2016-05-25 DIAGNOSIS — I340 Nonrheumatic mitral (valve) insufficiency: Secondary | ICD-10-CM

## 2016-06-08 ENCOUNTER — Other Ambulatory Visit: Payer: Self-pay

## 2016-06-08 ENCOUNTER — Ambulatory Visit (HOSPITAL_COMMUNITY): Payer: Medicare Other | Attending: Cardiology

## 2016-06-08 DIAGNOSIS — I10 Essential (primary) hypertension: Secondary | ICD-10-CM | POA: Insufficient documentation

## 2016-06-08 DIAGNOSIS — E785 Hyperlipidemia, unspecified: Secondary | ICD-10-CM | POA: Insufficient documentation

## 2016-06-08 DIAGNOSIS — I340 Nonrheumatic mitral (valve) insufficiency: Secondary | ICD-10-CM | POA: Insufficient documentation

## 2016-06-08 DIAGNOSIS — I253 Aneurysm of heart: Secondary | ICD-10-CM | POA: Insufficient documentation

## 2016-06-08 DIAGNOSIS — I371 Nonrheumatic pulmonary valve insufficiency: Secondary | ICD-10-CM | POA: Diagnosis not present

## 2016-06-08 DIAGNOSIS — M4800 Spinal stenosis, site unspecified: Secondary | ICD-10-CM | POA: Diagnosis not present

## 2016-06-08 DIAGNOSIS — Z853 Personal history of malignant neoplasm of breast: Secondary | ICD-10-CM | POA: Diagnosis not present

## 2016-06-14 ENCOUNTER — Ambulatory Visit
Admission: RE | Admit: 2016-06-14 | Discharge: 2016-06-14 | Disposition: A | Discharge status: Home / Self Care | Payer: Medicare Other | Source: Ambulatory Visit | Attending: Geriatric Medicine | Admitting: Geriatric Medicine

## 2016-06-14 DIAGNOSIS — Z1231 Encounter for screening mammogram for malignant neoplasm of breast: Secondary | ICD-10-CM

## 2016-07-31 ENCOUNTER — Other Ambulatory Visit: Payer: Self-pay | Admitting: Orthopedic Surgery

## 2016-07-31 DIAGNOSIS — M25512 Pain in left shoulder: Secondary | ICD-10-CM

## 2016-08-08 ENCOUNTER — Ambulatory Visit
Admission: RE | Admit: 2016-08-08 | Discharge: 2016-08-08 | Disposition: A | Discharge status: Home / Self Care | Payer: Medicare Other | Source: Ambulatory Visit | Attending: Orthopedic Surgery | Admitting: Orthopedic Surgery

## 2016-08-08 ENCOUNTER — Other Ambulatory Visit: Payer: Self-pay | Admitting: Orthopedic Surgery

## 2016-08-08 DIAGNOSIS — M25512 Pain in left shoulder: Secondary | ICD-10-CM

## 2016-08-15 ENCOUNTER — Other Ambulatory Visit: Payer: Self-pay | Admitting: Gastroenterology

## 2016-08-29 ENCOUNTER — Encounter (HOSPITAL_COMMUNITY): Payer: Self-pay | Admitting: Emergency Medicine

## 2016-08-29 ENCOUNTER — Encounter (HOSPITAL_COMMUNITY): Payer: Self-pay | Admitting: *Deleted

## 2016-08-30 ENCOUNTER — Other Ambulatory Visit: Payer: Self-pay | Admitting: Nurse Practitioner

## 2016-08-30 ENCOUNTER — Ambulatory Visit (HOSPITAL_COMMUNITY): Admission: RE | Admit: 2016-08-30 | Payer: Medicare Other | Source: Ambulatory Visit | Admitting: Gastroenterology

## 2016-08-30 ENCOUNTER — Ambulatory Visit
Admission: RE | Admit: 2016-08-30 | Discharge: 2016-08-30 | Disposition: A | Discharge status: Home / Self Care | Payer: Medicare Other | Source: Ambulatory Visit | Attending: Nurse Practitioner | Admitting: Nurse Practitioner

## 2016-08-30 ENCOUNTER — Encounter (HOSPITAL_COMMUNITY): Admission: RE | Payer: Self-pay | Source: Ambulatory Visit

## 2016-08-30 DIAGNOSIS — M545 Low back pain, unspecified: Secondary | ICD-10-CM

## 2016-08-30 SURGERY — ESOPHAGOGASTRODUODENOSCOPY (EGD) WITH PROPOFOL
Anesthesia: Monitor Anesthesia Care

## 2016-09-06 ENCOUNTER — Other Ambulatory Visit: Payer: Self-pay | Admitting: Geriatric Medicine

## 2016-09-06 DIAGNOSIS — M545 Low back pain, unspecified: Secondary | ICD-10-CM

## 2016-09-19 ENCOUNTER — Ambulatory Visit
Admission: RE | Admit: 2016-09-19 | Discharge: 2016-09-19 | Disposition: A | Discharge status: Home / Self Care | Payer: Medicare Other | Source: Ambulatory Visit | Attending: Geriatric Medicine | Admitting: Geriatric Medicine

## 2016-09-19 DIAGNOSIS — M545 Low back pain, unspecified: Secondary | ICD-10-CM

## 2016-10-01 ENCOUNTER — Other Ambulatory Visit: Payer: Self-pay | Admitting: Gastroenterology

## 2016-10-04 ENCOUNTER — Encounter (HOSPITAL_COMMUNITY): Payer: Self-pay | Admitting: *Deleted

## 2016-10-09 ENCOUNTER — Ambulatory Visit (HOSPITAL_COMMUNITY): Payer: Medicare Other | Admitting: Anesthesiology

## 2016-10-09 ENCOUNTER — Encounter (HOSPITAL_COMMUNITY): Payer: Self-pay | Admitting: *Deleted

## 2016-10-09 ENCOUNTER — Ambulatory Visit (HOSPITAL_COMMUNITY)
Admission: RE | Admit: 2016-10-09 | Discharge: 2016-10-09 | Disposition: A | Discharge status: Home / Self Care | Payer: Medicare Other | Source: Ambulatory Visit | Attending: Gastroenterology | Admitting: Gastroenterology

## 2016-10-09 ENCOUNTER — Encounter (HOSPITAL_COMMUNITY)
Admission: RE | Disposition: A | Discharge status: Home / Self Care | Payer: Self-pay | Source: Ambulatory Visit | Attending: Gastroenterology

## 2016-10-09 DIAGNOSIS — I340 Nonrheumatic mitral (valve) insufficiency: Secondary | ICD-10-CM | POA: Diagnosis not present

## 2016-10-09 DIAGNOSIS — E039 Hypothyroidism, unspecified: Secondary | ICD-10-CM | POA: Diagnosis not present

## 2016-10-09 DIAGNOSIS — E785 Hyperlipidemia, unspecified: Secondary | ICD-10-CM | POA: Insufficient documentation

## 2016-10-09 DIAGNOSIS — M1712 Unilateral primary osteoarthritis, left knee: Secondary | ICD-10-CM | POA: Diagnosis not present

## 2016-10-09 DIAGNOSIS — Z853 Personal history of malignant neoplasm of breast: Secondary | ICD-10-CM | POA: Insufficient documentation

## 2016-10-09 DIAGNOSIS — K222 Esophageal obstruction: Secondary | ICD-10-CM | POA: Diagnosis not present

## 2016-10-09 DIAGNOSIS — R1314 Dysphagia, pharyngoesophageal phase: Secondary | ICD-10-CM | POA: Diagnosis present

## 2016-10-09 DIAGNOSIS — I129 Hypertensive chronic kidney disease with stage 1 through stage 4 chronic kidney disease, or unspecified chronic kidney disease: Secondary | ICD-10-CM | POA: Diagnosis not present

## 2016-10-09 DIAGNOSIS — Z79899 Other long term (current) drug therapy: Secondary | ICD-10-CM | POA: Insufficient documentation

## 2016-10-09 DIAGNOSIS — K3189 Other diseases of stomach and duodenum: Secondary | ICD-10-CM | POA: Insufficient documentation

## 2016-10-09 DIAGNOSIS — K449 Diaphragmatic hernia without obstruction or gangrene: Secondary | ICD-10-CM | POA: Insufficient documentation

## 2016-10-09 DIAGNOSIS — N183 Chronic kidney disease, stage 3 (moderate): Secondary | ICD-10-CM | POA: Diagnosis not present

## 2016-10-09 DIAGNOSIS — Z86010 Personal history of colonic polyps: Secondary | ICD-10-CM | POA: Insufficient documentation

## 2016-10-09 HISTORY — DX: Spinal stenosis, site unspecified: M48.00

## 2016-10-09 HISTORY — PX: ESOPHAGOGASTRODUODENOSCOPY: SHX5428

## 2016-10-09 HISTORY — DX: Cardiac murmur, unspecified: R01.1

## 2016-10-09 SURGERY — EGD (ESOPHAGOGASTRODUODENOSCOPY)
Anesthesia: Monitor Anesthesia Care

## 2016-10-09 MED ORDER — PROPOFOL 10 MG/ML IV BOLUS
INTRAVENOUS | Status: AC
  Filled 2016-10-09: qty 40

## 2016-10-09 MED ORDER — PROPOFOL 10 MG/ML IV BOLUS
INTRAVENOUS | Status: DC | PRN
  Administered 2016-10-09: 20 mg via INTRAVENOUS
  Administered 2016-10-09: 30 mg via INTRAVENOUS

## 2016-10-09 MED ORDER — LIDOCAINE 2% (20 MG/ML) 5 ML SYRINGE
INTRAMUSCULAR | Status: AC
  Filled 2016-10-09: qty 5

## 2016-10-09 MED ORDER — SODIUM CHLORIDE 0.9 % IV SOLN: INTRAVENOUS | Status: DC

## 2016-10-09 MED ORDER — LACTATED RINGERS IV SOLN
INTRAVENOUS | Status: DC
  Administered 2016-10-09: 1000 mL via INTRAVENOUS

## 2016-10-09 MED ORDER — LIDOCAINE 2% (20 MG/ML) 5 ML SYRINGE
INTRAMUSCULAR | Status: DC | PRN
  Administered 2016-10-09: 100 mg via INTRAVENOUS

## 2016-10-09 NOTE — Op Note (Signed)
EGD showed a benign stricture at 35 cm from insertion. Minimal resistance was felt during passage of the scope into the gastric cavity. It was dilated with 13.5 mm and 15 mm balloon for 1 minute each, which resulted in mucosal breakage, and minimal bleeding, along with adequate dilatation of the stricture. A small hiatal hernia was noted. Few erosions were noted in the gastric cavity. The shape of antrum appeared deformed, the scope could easily be advanced into the pylorus otherwise. The duodenal bulb and the duodenum appeared unremarkable.

## 2016-10-09 NOTE — Brief Op Note (Signed)
10/09/2016  12:56 PM  PATIENT:  [name]  83 y.o. female  PRE-OPERATIVE DIAGNOSIS:  Esophageal dysphagia  POST-OPERATIVE DIAGNOSIS:  Hiatal hernia, esophageal stricture at 36.  Balloon dilatation 13.5 and 15.  PROCEDURE:  Procedure(s): ESOPHAGOGASTRODUODENOSCOPY (EGD) (N/A)  SURGEON:  Surgeon(s) and Role:    * Ronnette Juniper, MD - Primary  PHYSICIAN ASSISTANT:   ASSISTANTS: none   ANESTHESIA:   MAC  EBL:  No intake/output data recorded.  BLOOD ADMINISTERED:none  DRAINS: none   LOCAL MEDICATIONS USED:  NONE  SPECIMEN:  No Specimen  DISPOSITION OF SPECIMEN:  N/A  COUNTS:  YES  TOURNIQUET:  * No tourniquets in log *  DICTATION: .Dragon Dictation  PLAN OF CARE: Discharge to home after PACU  PATIENT DISPOSITION:  PACU - hemodynamically stable.   Delay start of Pharmacological VTE agent (>24hrs) due to surgical blood loss or risk of bleeding: no

## 2016-10-09 NOTE — H&P (Signed)
History of present illness:  83 year old Caucasian female seen in the office on 08/13/16 with complaints of feeling sick and nauseous, and only able to finish half a sandwich, she felt food getting stuck and not going down. She had been taking small meals and chewing her food properly. However, despite chewing food properly food stays in the lower part of the chest and takes a long time to move forward. She denies unintentional weight loss, abdominal pain, acid reflux, chest pain or heartburn. Stool softeners are taken regularly and she reports regular bowel movements. She denies unintentional weight loss or change in appetite. Colonoscopy from 05/2019 by Dr. Earle Gell for hematochezia and personal history of colonic polyps showed 5 mm rectal polyp, 5 mm ascending colon polyp 5 mm cecal polyp and diverticulosis in sigmoid and descending colon. Colonoscopy from 2008 showed small transverse polyp and left-sided diverticulosis. EGD by Dr. Timmothy Euler in November 2007 showed a benign distal esophageal stricture, dilated to 15 mm, hiatal hernia, this was performed secondary to food bolus impaction. BMP from 4/18 showed BUN 28, creatinine 1.33 and GFR 38. CBC from 3/18 showed hemoglobin was 10 MCV 88.6 and platelet 260.  Past medical history: RAI for hypothyroidism with subsequent treatment for hypothyroidism Hypertension, proteinuria secondary to hypertension, hyperlipidemia, microhematuria, impaired sugar, osteoarthritis of left knee Low back pain 2007, esophageal stricture dilated in 2007, diverticulitis in 1998, allergic rhinitis, migraine, cardiomegaly, calcific tendinitis of left shoulder, severe spinal stenosis. Stage III renal disease, mild mitral regurgitation, lung nodule, iron deficiency, left breast cancer in 1996.  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

## 2016-10-09 NOTE — Anesthesia Preprocedure Evaluation (Signed)
Anesthesia Evaluation  Patient identified by MRN, date of birth, ID band Patient awake    Reviewed: Allergy & Precautions, NPO status , Patient's Chart, lab work & pertinent test results  Airway Mallampati: II  TM Distance: >3 FB     Dental   Pulmonary neg pulmonary ROS,    breath sounds clear to auscultation       Cardiovascular hypertension, + Valvular Problems/Murmurs  Rhythm:Regular Rate:Normal     Neuro/Psych    GI/Hepatic negative GI ROS, Neg liver ROS,   Endo/Other  Hypothyroidism   Renal/GU negative Renal ROS     Musculoskeletal  (+) Arthritis ,   Abdominal   Peds  Hematology   Anesthesia Other Findings   Reproductive/Obstetrics                             Anesthesia Physical Anesthesia Plan  ASA: III  Anesthesia Plan: MAC   Post-op Pain Management:    Induction: Intravenous  PONV Risk Score and Plan: 2 and Ondansetron and Dexamethasone  Airway Management Planned: Simple Face Mask  Additional Equipment:   Intra-op Plan:   Post-operative Plan:   Informed Consent: I have reviewed the patients History and Physical, chart, labs and discussed the procedure including the risks, benefits and alternatives for the proposed anesthesia with the patient or authorized representative who has indicated his/her understanding and acceptance.   Dental advisory given  Plan Discussed with: CRNA and Anesthesiologist  Anesthesia Plan Comments:         Anesthesia Quick Evaluation

## 2016-10-09 NOTE — Anesthesia Postprocedure Evaluation (Signed)
Anesthesia Post Note  Patient: [name]  Procedure(s) Performed: Procedure(s) (LRB): ESOPHAGOGASTRODUODENOSCOPY (EGD) (N/A)     Patient location during evaluation: PACU Anesthesia Type: MAC Level of consciousness: awake Pain management: pain level controlled Vital Signs Assessment: post-procedure vital signs reviewed and stable Respiratory status: spontaneous breathing Cardiovascular status: stable Anesthetic complications: no    Last Vitals:  Vitals:   10/09/16 1305 10/09/16 1316  BP: (!) 143/63 (!) 175/68  Pulse: 67   Resp: 13   Temp: 36.6 C     Last Pain:  Vitals:   10/09/16 1305  TempSrc: Oral                 Eh Sesay

## 2016-10-09 NOTE — Transfer of Care (Signed)
Immediate Anesthesia Transfer of Care Note  Patient: [name]  Procedure(s) Performed: Procedure(s): ESOPHAGOGASTRODUODENOSCOPY (EGD) (N/A)  Patient Location: PACU  Anesthesia Type:MAC  Level of Consciousness: awake, alert  and oriented  Airway & Oxygen Therapy: Patient Spontanous Breathing and Patient connected to nasal cannula oxygen  Post-op Assessment: Report given to RN and Post -op Vital signs reviewed and stable  Post vital signs: Reviewed and stable  Last Vitals:  Vitals:   10/09/16 1134  BP: (!) 183/75  Pulse: 64  Resp: 12  Temp: 36.4 C    Last Pain:  Vitals:   10/09/16 1134  TempSrc: Oral         Complications: No apparent anesthesia complications

## 2016-10-09 NOTE — Discharge Instructions (Signed)
Patient advised to take soft diet, small bites, and chew food properly. If symptoms reappear in a few weeks, she may need repeat EGD with further dilatation.  YOU HAD AN ENDOSCOPIC PROCEDURE TODAY: Refer to the procedure report and other information in the discharge instructions given to you for any specific questions about what was found during the examination. If this information does not answer your questions, please call [facility] office at [phone] to clarify.   YOU SHOULD EXPECT: Some feelings of bloating in the abdomen. Passage of more gas than usual. Walking can help get rid of the air that was put into your GI tract during the procedure and reduce the bloating. If you had a lower endoscopy (such as a colonoscopy or flexible sigmoidoscopy) you may notice spotting of blood in your stool or on the toilet paper. Some abdominal soreness may be present for a day or two, also.  DIET: Your first meal following the procedure should be a light meal and then it is ok to progress to your normal diet. A half-sandwich or bowl of soup is an example of a good first meal. Heavy or fried foods are harder to digest and may make you feel nauseous or bloated. Drink plenty of fluids but you should avoid alcoholic beverages for 24 hours. If you had a esophageal dilation, please see attached instructions for diet.   ACTIVITY: Your care partner should take you home directly after the procedure. You should plan to take it easy, moving slowly for the rest of the day. You can resume normal activity the day after the procedure however YOU SHOULD NOT DRIVE, use power tools, machinery or perform tasks that involve climbing or major physical exertion for 24 hours (because of the sedation medicines used during the test).   SYMPTOMS TO REPORT IMMEDIATELY: A gastroenterologist can be reached at any hour. Please call [phone]  for any of the following symptoms:  Following lower endoscopy (colonoscopy, flexible  sigmoidoscopy) Excessive amounts of blood in the stool  Significant tenderness, worsening of abdominal pains  Swelling of the abdomen that is new, acute  Fever of 100 or higher  Following upper endoscopy (EGD, EUS, ERCP, esophageal dilation) Vomiting of blood or coffee ground material  New, significant abdominal pain  New, significant chest pain or pain under the shoulder blades  Painful or persistently difficult swallowing  New shortness of breath  Black, tarry-looking or red, bloody stools  FOLLOW UP:  If any biopsies were taken you will be contacted by phone or by letter within the next 1-3 weeks. Call [phone]  if you have not heard about the biopsies in 3 weeks.  Please also call with any specific questions about appointments or follow up tests.

## 2016-10-09 NOTE — Op Note (Signed)
[facility] Patient Name: [name] Procedure Date: 10/09/2016 MRN: [id number] Attending MD: Ronnette Juniper , MD Date of Birth: 1926-10-23 CSN: [id number] Age: 83 Admit Type: Outpatient Procedure:                Upper GI endoscopy Indications:              Dysphagia Providers:                Ronnette Juniper, MD, Laverta Baltimore RN, RN, Tinnie Gens, Technician, [facility], CRNA Referring MD:              Medicines:                Monitored Anesthesia Care Complications:            No immediate complications. Estimated Blood Loss:     Estimated blood loss was minimal. Procedure:                Pre-Anesthesia Assessment:                           - Prior to the procedure, a History and Physical                            was performed, and patient medications and                            allergies were reviewed. The patient's tolerance of                            previous anesthesia was also reviewed. The risks                            and benefits of the procedure and the sedation                            options and risks were discussed with the patient.                            All questions were answered, and informed consent                            was obtained. Prior Anticoagulants: The patient has                            taken no previous anticoagulant or antiplatelet                            agents. ASA Grade Assessment: III - A patient with                            severe systemic disease. After reviewing the risks  and benefits, the patient was deemed in                            satisfactory condition to undergo the procedure.                           After obtaining informed consent, the endoscope was                            passed under direct vision. Throughout the                            procedure, the patient's blood pressure, pulse, and                             oxygen saturations were monitored continuously. The                            EG-[id number] ([id number]) scope was introduced through the                            mouth, and advanced to the second part of duodenum.                            The upper GI endoscopy was accomplished without                            difficulty. The patient tolerated the procedure                            well. Findings:      One moderate (circumferential scarring or stenosis; an endoscope may       pass) benign-appearing, intrinsic stenosis was found 34 to 35 cm from       the incisors. And was traversed. A TTS dilator was passed through the       scope. Dilation with a 12-13.5-15 mm balloon dilator was performed to 15       mm. The dilation site was examined following endoscope reinsertion and       showed complete resolution of luminal narrowing. Estimated blood loss       was minimal.      A 2 cm hiatal hernia was present.      A few dispersed, non-bleeding erosions were found in the gastric body.       There were no stigmata of recent bleeding.      The cardia and gastric fundus were normal on retroflexion.      A deformity was found in the gastric antrum.      The examined duodenum was normal. Impression:               - Benign-appearing esophageal stenosis. Dilated.                           - 2 cm hiatal hernia.                           -  Non-bleeding erosive gastropathy.                           - Acquired deformity in the gastric antrum.                           - Normal examined duodenum.                           - No specimens collected. Moderate Sedation:      Patient did not receive moderate sedation for this procedure, but       instead received monitored anesthesia care. Recommendation:           - Patient has a contact number available for                            emergencies. The signs and symptoms of potential                            delayed complications were discussed with the                             patient. Return to normal activities tomorrow.                            Written discharge instructions were provided to the                            patient.                           - Mechanical soft diet.                           - Continue present medications.                           - Repeat upper endoscopy at appointment to be                            scheduled if patient has reoccurence of symptoms. Procedure Code(s):        --- Professional ---                           [phone], Esophagogastroduodenoscopy, flexible,                            transoral; with transendoscopic balloon dilation of                            esophagus (less than 30 mm diameter) Diagnosis Code(s):        --- Professional ---                           K22.2, Esophageal obstruction  K44.9, Diaphragmatic hernia without obstruction or                            gangrene                           K31.89, Other diseases of stomach and duodenum                           R13.10, Dysphagia, unspecified CPT copyright 2016 [employer] Association. All rights reserved. The codes documented in this report are preliminary and upon coder review may  be revised to meet current compliance requirements. Ronnette Juniper, MD 10/09/2016 1:04:11 PM This report has been signed electronically. Number of Addenda: 0

## 2016-10-09 NOTE — Anesthesia Procedure Notes (Signed)
Procedure Name: MAC Performed by: Noralyn Pick D Oxygen Delivery Method: Nasal cannula

## 2016-10-10 ENCOUNTER — Encounter (HOSPITAL_COMMUNITY): Payer: Self-pay | Admitting: Gastroenterology

## 2017-05-29 ENCOUNTER — Other Ambulatory Visit: Payer: Self-pay | Admitting: Geriatric Medicine

## 2017-05-29 DIAGNOSIS — I340 Nonrheumatic mitral (valve) insufficiency: Secondary | ICD-10-CM

## 2017-06-17 ENCOUNTER — Other Ambulatory Visit: Payer: Self-pay

## 2017-06-17 ENCOUNTER — Ambulatory Visit (HOSPITAL_COMMUNITY): Payer: Medicare Other | Attending: Cardiovascular Disease

## 2017-06-17 DIAGNOSIS — I350 Nonrheumatic aortic (valve) stenosis: Secondary | ICD-10-CM | POA: Insufficient documentation

## 2017-06-17 DIAGNOSIS — I340 Nonrheumatic mitral (valve) insufficiency: Secondary | ICD-10-CM | POA: Diagnosis not present

## 2017-06-17 DIAGNOSIS — I272 Pulmonary hypertension, unspecified: Secondary | ICD-10-CM | POA: Diagnosis not present

## 2017-11-14 ENCOUNTER — Ambulatory Visit
Admission: RE | Admit: 2017-11-14 | Discharge: 2017-11-14 | Disposition: A | Discharge status: Home / Self Care | Payer: Medicare Other | Source: Ambulatory Visit | Attending: Geriatric Medicine | Admitting: Geriatric Medicine

## 2017-11-14 ENCOUNTER — Other Ambulatory Visit: Payer: Self-pay | Admitting: Geriatric Medicine

## 2017-11-14 DIAGNOSIS — M25552 Pain in left hip: Secondary | ICD-10-CM

## 2017-12-19 ENCOUNTER — Inpatient Hospital Stay (HOSPITAL_COMMUNITY)
Admission: EM | Admit: 2017-12-19 | Discharge: 2017-12-25 | DRG: 381 | Disposition: A | Discharge status: Skilled Nursing Facility | Payer: Medicare Other | Attending: Internal Medicine | Admitting: Internal Medicine

## 2017-12-19 ENCOUNTER — Encounter (HOSPITAL_COMMUNITY): Payer: Self-pay | Admitting: Emergency Medicine

## 2017-12-19 DIAGNOSIS — Z974 Presence of external hearing-aid: Secondary | ICD-10-CM

## 2017-12-19 DIAGNOSIS — S32512A Fracture of superior rim of left pubis, initial encounter for closed fracture: Secondary | ICD-10-CM

## 2017-12-19 DIAGNOSIS — D649 Anemia, unspecified: Secondary | ICD-10-CM | POA: Diagnosis present

## 2017-12-19 DIAGNOSIS — K631 Perforation of intestine (nontraumatic): Secondary | ICD-10-CM

## 2017-12-19 DIAGNOSIS — E162 Hypoglycemia, unspecified: Secondary | ICD-10-CM | POA: Diagnosis not present

## 2017-12-19 DIAGNOSIS — F0390 Unspecified dementia without behavioral disturbance: Secondary | ICD-10-CM | POA: Diagnosis present

## 2017-12-19 DIAGNOSIS — Z853 Personal history of malignant neoplasm of breast: Secondary | ICD-10-CM

## 2017-12-19 DIAGNOSIS — K668 Other specified disorders of peritoneum: Secondary | ICD-10-CM | POA: Diagnosis not present

## 2017-12-19 DIAGNOSIS — S32591D Other specified fracture of right pubis, subsequent encounter for fracture with routine healing: Secondary | ICD-10-CM

## 2017-12-19 DIAGNOSIS — S3210XA Unspecified fracture of sacrum, initial encounter for closed fracture: Secondary | ICD-10-CM

## 2017-12-19 DIAGNOSIS — E876 Hypokalemia: Secondary | ICD-10-CM | POA: Diagnosis not present

## 2017-12-19 DIAGNOSIS — E039 Hypothyroidism, unspecified: Secondary | ICD-10-CM | POA: Diagnosis present

## 2017-12-19 DIAGNOSIS — K5903 Drug induced constipation: Secondary | ICD-10-CM | POA: Diagnosis present

## 2017-12-19 DIAGNOSIS — K265 Chronic or unspecified duodenal ulcer with perforation: Secondary | ICD-10-CM | POA: Diagnosis not present

## 2017-12-19 DIAGNOSIS — E785 Hyperlipidemia, unspecified: Secondary | ICD-10-CM | POA: Diagnosis present

## 2017-12-19 DIAGNOSIS — H9190 Unspecified hearing loss, unspecified ear: Secondary | ICD-10-CM | POA: Diagnosis present

## 2017-12-19 DIAGNOSIS — Y92009 Unspecified place in unspecified non-institutional (private) residence as the place of occurrence of the external cause: Secondary | ICD-10-CM

## 2017-12-19 DIAGNOSIS — Z79890 Hormone replacement therapy (postmenopausal): Secondary | ICD-10-CM

## 2017-12-19 DIAGNOSIS — S32511A Fracture of superior rim of right pubis, initial encounter for closed fracture: Secondary | ICD-10-CM

## 2017-12-19 DIAGNOSIS — Z90710 Acquired absence of both cervix and uterus: Secondary | ICD-10-CM

## 2017-12-19 DIAGNOSIS — M1990 Unspecified osteoarthritis, unspecified site: Secondary | ICD-10-CM | POA: Diagnosis present

## 2017-12-19 DIAGNOSIS — Z9841 Cataract extraction status, right eye: Secondary | ICD-10-CM

## 2017-12-19 DIAGNOSIS — Z79899 Other long term (current) drug therapy: Secondary | ICD-10-CM

## 2017-12-19 DIAGNOSIS — M4800 Spinal stenosis, site unspecified: Secondary | ICD-10-CM | POA: Diagnosis present

## 2017-12-19 DIAGNOSIS — G8929 Other chronic pain: Secondary | ICD-10-CM | POA: Diagnosis present

## 2017-12-19 DIAGNOSIS — S32592A Other specified fracture of left pubis, initial encounter for closed fracture: Secondary | ICD-10-CM

## 2017-12-19 DIAGNOSIS — I10 Essential (primary) hypertension: Secondary | ICD-10-CM | POA: Diagnosis present

## 2017-12-19 DIAGNOSIS — T501X5A Adverse effect of loop [high-ceiling] diuretics, initial encounter: Secondary | ICD-10-CM | POA: Diagnosis not present

## 2017-12-19 DIAGNOSIS — M40209 Unspecified kyphosis, site unspecified: Secondary | ICD-10-CM | POA: Diagnosis present

## 2017-12-19 DIAGNOSIS — Z9842 Cataract extraction status, left eye: Secondary | ICD-10-CM

## 2017-12-19 DIAGNOSIS — S32059D Unspecified fracture of fifth lumbar vertebra, subsequent encounter for fracture with routine healing: Secondary | ICD-10-CM

## 2017-12-19 DIAGNOSIS — M7989 Other specified soft tissue disorders: Secondary | ICD-10-CM | POA: Diagnosis present

## 2017-12-19 DIAGNOSIS — S3210XD Unspecified fracture of sacrum, subsequent encounter for fracture with routine healing: Secondary | ICD-10-CM

## 2017-12-19 DIAGNOSIS — S32592D Other specified fracture of left pubis, subsequent encounter for fracture with routine healing: Secondary | ICD-10-CM

## 2017-12-19 DIAGNOSIS — S32591A Other specified fracture of right pubis, initial encounter for closed fracture: Secondary | ICD-10-CM

## 2017-12-19 DIAGNOSIS — W19XXXD Unspecified fall, subsequent encounter: Secondary | ICD-10-CM | POA: Diagnosis present

## 2017-12-19 DIAGNOSIS — T404X5A Adverse effect of other synthetic narcotics, initial encounter: Secondary | ICD-10-CM | POA: Diagnosis present

## 2017-12-19 DIAGNOSIS — D62 Acute posthemorrhagic anemia: Secondary | ICD-10-CM | POA: Diagnosis present

## 2017-12-19 HISTORY — DX: Duodenal ulcer, unspecified as acute or chronic, without hemorrhage or perforation: K26.9

## 2017-12-19 LAB — COMPREHENSIVE METABOLIC PANEL
ALT: 8 U/L (ref 0–44)
ANION GAP: 8 (ref 5–15)
AST: 15 U/L (ref 15–41)
Albumin: 2.9 g/dL — ABNORMAL LOW (ref 3.5–5.0)
Alkaline Phosphatase: 118 U/L (ref 38–126)
BUN: 18 mg/dL (ref 8–23)
CALCIUM: 8 mg/dL — AB (ref 8.9–10.3)
CO2: 25 mmol/L (ref 22–32)
Chloride: 98 mmol/L (ref 98–111)
Creatinine, Ser: 1.25 mg/dL — ABNORMAL HIGH (ref 0.44–1.00)
GFR calc Af Amer: 43 mL/min — ABNORMAL LOW (ref >60)
GFR calc non Af Amer: 37 mL/min — ABNORMAL LOW (ref >60)
GLUCOSE: 132 mg/dL — AB (ref 70–99)
Potassium: 3.8 mmol/L (ref 3.5–5.1)
SODIUM: 131 mmol/L — AB (ref 135–145)
TOTAL PROTEIN: 5.5 g/dL — AB (ref 6.5–8.1)
Total Bilirubin: 0.5 mg/dL (ref 0.3–1.2)

## 2017-12-19 LAB — CBC
HCT: 28.7 % — ABNORMAL LOW (ref 36.0–46.0)
Hemoglobin: 9 g/dL — ABNORMAL LOW (ref 12.0–15.0)
MCH: 30.2 pg (ref 26.0–34.0)
MCHC: 31.4 g/dL (ref 30.0–36.0)
MCV: 96.3 fL (ref 78.0–100.0)
Platelets: 327 10*3/uL (ref 150–400)
RBC: 2.98 MIL/uL — ABNORMAL LOW (ref 3.87–5.11)
RDW: 15.9 % — AB (ref 11.5–15.5)
WBC: 11.5 10*3/uL — AB (ref 4.0–10.5)

## 2017-12-19 LAB — URINALYSIS, ROUTINE W REFLEX MICROSCOPIC
Bilirubin Urine: NEGATIVE
Glucose, UA: NEGATIVE mg/dL
Hgb urine dipstick: NEGATIVE
Ketones, ur: NEGATIVE mg/dL
Leukocytes, UA: NEGATIVE
NITRITE: NEGATIVE
PH: 5 (ref 5.0–8.0)
Protein, ur: 100 mg/dL — AB
SPECIFIC GRAVITY, URINE: 1.015 (ref 1.005–1.030)

## 2017-12-19 LAB — LIPASE, BLOOD: LIPASE: 25 U/L (ref 11–51)

## 2017-12-19 NOTE — ED Triage Notes (Signed)
Patient reports right lower abdominal pain for 2 days with constipation , denies emesis or diarrhea , pt. added right shoulder joint pain onset this evening , denies recent fall or injury . No fever or chills .

## 2017-12-20 ENCOUNTER — Other Ambulatory Visit: Payer: Self-pay

## 2017-12-20 ENCOUNTER — Emergency Department (HOSPITAL_COMMUNITY): Payer: Medicare Other

## 2017-12-20 ENCOUNTER — Encounter (HOSPITAL_COMMUNITY): Payer: Self-pay | Admitting: General Practice

## 2017-12-20 DIAGNOSIS — K668 Other specified disorders of peritoneum: Secondary | ICD-10-CM

## 2017-12-20 DIAGNOSIS — M40209 Unspecified kyphosis, site unspecified: Secondary | ICD-10-CM | POA: Diagnosis present

## 2017-12-20 DIAGNOSIS — E162 Hypoglycemia, unspecified: Secondary | ICD-10-CM | POA: Diagnosis not present

## 2017-12-20 DIAGNOSIS — G8929 Other chronic pain: Secondary | ICD-10-CM | POA: Diagnosis present

## 2017-12-20 DIAGNOSIS — K265 Chronic or unspecified duodenal ulcer with perforation: Principal | ICD-10-CM

## 2017-12-20 DIAGNOSIS — S32591D Other specified fracture of right pubis, subsequent encounter for fracture with routine healing: Secondary | ICD-10-CM | POA: Diagnosis not present

## 2017-12-20 DIAGNOSIS — M1990 Unspecified osteoarthritis, unspecified site: Secondary | ICD-10-CM | POA: Diagnosis present

## 2017-12-20 DIAGNOSIS — M7989 Other specified soft tissue disorders: Secondary | ICD-10-CM | POA: Diagnosis present

## 2017-12-20 DIAGNOSIS — E039 Hypothyroidism, unspecified: Secondary | ICD-10-CM | POA: Diagnosis present

## 2017-12-20 DIAGNOSIS — S32059D Unspecified fracture of fifth lumbar vertebra, subsequent encounter for fracture with routine healing: Secondary | ICD-10-CM | POA: Diagnosis not present

## 2017-12-20 DIAGNOSIS — Z79890 Hormone replacement therapy (postmenopausal): Secondary | ICD-10-CM | POA: Diagnosis not present

## 2017-12-20 DIAGNOSIS — D649 Anemia, unspecified: Secondary | ICD-10-CM | POA: Diagnosis present

## 2017-12-20 DIAGNOSIS — S32592D Other specified fracture of left pubis, subsequent encounter for fracture with routine healing: Secondary | ICD-10-CM | POA: Diagnosis not present

## 2017-12-20 DIAGNOSIS — K5903 Drug induced constipation: Secondary | ICD-10-CM | POA: Diagnosis present

## 2017-12-20 DIAGNOSIS — E785 Hyperlipidemia, unspecified: Secondary | ICD-10-CM | POA: Diagnosis present

## 2017-12-20 DIAGNOSIS — E876 Hypokalemia: Secondary | ICD-10-CM | POA: Diagnosis not present

## 2017-12-20 DIAGNOSIS — F0390 Unspecified dementia without behavioral disturbance: Secondary | ICD-10-CM | POA: Diagnosis present

## 2017-12-20 DIAGNOSIS — W19XXXD Unspecified fall, subsequent encounter: Secondary | ICD-10-CM | POA: Diagnosis present

## 2017-12-20 DIAGNOSIS — S3210XD Unspecified fracture of sacrum, subsequent encounter for fracture with routine healing: Secondary | ICD-10-CM | POA: Diagnosis not present

## 2017-12-20 DIAGNOSIS — D62 Acute posthemorrhagic anemia: Secondary | ICD-10-CM | POA: Diagnosis present

## 2017-12-20 DIAGNOSIS — Z79899 Other long term (current) drug therapy: Secondary | ICD-10-CM | POA: Diagnosis not present

## 2017-12-20 DIAGNOSIS — Y92009 Unspecified place in unspecified non-institutional (private) residence as the place of occurrence of the external cause: Secondary | ICD-10-CM | POA: Diagnosis not present

## 2017-12-20 DIAGNOSIS — I10 Essential (primary) hypertension: Secondary | ICD-10-CM | POA: Diagnosis present

## 2017-12-20 DIAGNOSIS — H9190 Unspecified hearing loss, unspecified ear: Secondary | ICD-10-CM | POA: Diagnosis present

## 2017-12-20 DIAGNOSIS — T404X5A Adverse effect of other synthetic narcotics, initial encounter: Secondary | ICD-10-CM | POA: Diagnosis present

## 2017-12-20 DIAGNOSIS — M4800 Spinal stenosis, site unspecified: Secondary | ICD-10-CM | POA: Diagnosis present

## 2017-12-20 DIAGNOSIS — S3210XA Unspecified fracture of sacrum, initial encounter for closed fracture: Secondary | ICD-10-CM | POA: Diagnosis not present

## 2017-12-20 MED ORDER — BISACODYL 10 MG RE SUPP: 10.0000 mg | Freq: Every day | RECTAL | Status: DC | PRN

## 2017-12-20 MED ORDER — PIPERACILLIN-TAZOBACTAM 3.375 G IVPB
3.3750 g | Freq: Three times a day (TID) | INTRAVENOUS | Status: DC
  Administered 2017-12-20 – 2017-12-24 (×12): 3.375 g via INTRAVENOUS
  Filled 2017-12-20 (×12): qty 50

## 2017-12-20 MED ORDER — LEVOTHYROXINE SODIUM 25 MCG PO TABS: 125.0000 ug | ORAL_TABLET | Freq: Every day | ORAL | Status: DC

## 2017-12-20 MED ORDER — PIPERACILLIN-TAZOBACTAM 3.375 G IVPB 30 MIN
3.3750 g | Freq: Once | INTRAVENOUS | Status: AC
  Administered 2017-12-20: 3.375 g via INTRAVENOUS
  Filled 2017-12-20: qty 50

## 2017-12-20 MED ORDER — ACETAMINOPHEN 650 MG RE SUPP: 650.0000 mg | Freq: Four times a day (QID) | RECTAL | Status: DC | PRN

## 2017-12-20 MED ORDER — HYDRALAZINE HCL 20 MG/ML IJ SOLN: 2.0000 mg | Freq: Three times a day (TID) | INTRAMUSCULAR | Status: DC | PRN

## 2017-12-20 MED ORDER — ACETAMINOPHEN 500 MG PO TABS: 1000.0000 mg | ORAL_TABLET | Freq: Two times a day (BID) | ORAL | Status: DC

## 2017-12-20 MED ORDER — IOHEXOL 300 MG/ML  SOLN
100.0000 mL | Freq: Once | INTRAMUSCULAR | Status: AC | PRN
  Administered 2017-12-20: 80 mL via INTRAVENOUS

## 2017-12-20 MED ORDER — ACETAMINOPHEN 325 MG PO TABS: 650.0000 mg | ORAL_TABLET | Freq: Four times a day (QID) | ORAL | Status: DC | PRN

## 2017-12-20 MED ORDER — LEVOTHYROXINE SODIUM 100 MCG IV SOLR
75.0000 ug | Freq: Every day | INTRAVENOUS | Status: DC
  Administered 2017-12-20 – 2017-12-23 (×4): 75 ug via INTRAVENOUS
  Filled 2017-12-20 (×5): qty 5

## 2017-12-20 MED ORDER — PANTOPRAZOLE SODIUM 40 MG IV SOLR
80.0000 mg | Freq: Once | INTRAVENOUS | Status: AC
  Administered 2017-12-20: 80 mg via INTRAVENOUS
  Filled 2017-12-20: qty 80

## 2017-12-20 MED ORDER — FAMOTIDINE IN NACL 20-0.9 MG/50ML-% IV SOLN
20.0000 mg | Freq: Two times a day (BID) | INTRAVENOUS | Status: DC
  Administered 2017-12-20: 20 mg via INTRAVENOUS
  Filled 2017-12-20: qty 50

## 2017-12-20 MED ORDER — TRAMADOL HCL 50 MG PO TABS: 50.0000 mg | ORAL_TABLET | Freq: Two times a day (BID) | ORAL | Status: DC | PRN

## 2017-12-20 MED ORDER — HEPARIN SODIUM (PORCINE) 5000 UNIT/ML IJ SOLN
5000.0000 [IU] | Freq: Three times a day (TID) | INTRAMUSCULAR | Status: DC
  Administered 2017-12-20 – 2017-12-21 (×3): 5000 [IU] via SUBCUTANEOUS
  Filled 2017-12-20 (×3): qty 1

## 2017-12-20 MED ORDER — MUSCLE RUB 10-15 % EX CREA
1.0000 "application " | TOPICAL_CREAM | CUTANEOUS | Status: DC | PRN
  Filled 2017-12-20: qty 85

## 2017-12-20 MED ORDER — SODIUM CHLORIDE 0.9 % IV SOLN
INTRAVENOUS | Status: DC
  Administered 2017-12-20: 12:00:00 via INTRAVENOUS

## 2017-12-20 MED ORDER — MORPHINE SULFATE (PF) 2 MG/ML IV SOLN
0.5000 mg | INTRAVENOUS | Status: DC | PRN
  Administered 2017-12-20 – 2017-12-21 (×2): 0.5 mg via INTRAVENOUS
  Filled 2017-12-20 (×2): qty 1

## 2017-12-20 NOTE — H&P (Signed)
History and Physical    [name] ERX:[id number] DOB: [birth year] DOA: 12/19/2017  PCP: [name], MD Patient coming from: Home   Chief Complaint: abdominal pain  HPI: [name] is a 84 y.o. female with medical history significant of hypothyroidism, spinal stenosis/kyphosis, HTN, HLD, h/o breast cancer, arthritis who presented with abdominal pain.  She is very hard of hearing, son helped with history.  She reports pain started 4 days prior to admission.  The pain is epigastric and sharp in nature.  She does have chronic constipation due to tramadol use and has to use a laxative every 3-4 days.  She felt that this was due to her constipation but it became severe and so she came in to the hospital.  She denies any other symptoms including fever, chills, nausea, vomiting, heartburn, bloody stools, melena, hematuria.  She has had esophageal dilation, hysterectomy and hernia repair.  She further had a fall in the second week of August and was told she had a pelvic fracture.  She has been ambulating with a walker.  Further symptoms include increased LE edema and weeping for which she has been on UNNA boots.  She is not aware of a diagnosis of heart failure.    ED Course: CT scan in the abdomen showed a small focus of free air between the proximal duodenum and the liver. General surgery was consulted and recommended IVF, complete bowel rest, IV zosyn and IV PPI.  Na ws 131.  Cr was 1.25.  WBC 11.5, H/H 9/28  Review of Systems: As per HPI otherwise 10 point review of systems negative.   Past Medical History:  Diagnosis Date  . Arthritis    All over  . Back pain   . Cancer [facility]) 1996   breast cancer, left   . Duodenal ulcer   . Edema leg   . Heart murmur    SLIGHT  . Hyperlipidemia   . Hypertension   . Hypothyroidism   . Spinal stenosis   . Thyroid disease   . Wears hearing aid    right    Past Surgical History:  Procedure Laterality Date  . ABDOMINAL HYSTERECTOMY      PARTIAL  . BREAST SURGERY  1996   lt lumpectomy-ca RADIATION DONE  . CARPAL TUNNEL RELEASE  2012   left  . CARPAL TUNNEL RELEASE Right 04/30/2013   Procedure: RIGHT CARPAL TUNNEL RELEASE;  Surgeon: Cammie Sickle., MD;  Location: [facility];  Service: Orthopedics;  Laterality: Right;  . COLONOSCOPY    . ERCP  2007  . ESOPHAGOGASTRODUODENOSCOPY N/A 10/09/2016   Procedure: ESOPHAGOGASTRODUODENOSCOPY (EGD);  Surgeon: Ronnette Juniper, MD;  Location: [facility];  Service: Gastroenterology;  Laterality: N/A;  . ESOPHAGOGASTRODUODENOSCOPY (EGD) WITH PROPOFOL N/A 11/01/2014   Procedure: ESOPHAGOGASTRODUODENOSCOPY (EGD) WITH PROPOFOL;  Surgeon: Garlan Fair, MD;  Location: [facility];  Service: Endoscopy;  Laterality: N/A;  Dr Wynetta Emery also wants the pt to have a Fluroscopy.  [facility] EYE SURGERY     both cataracts  . HERNIA REPAIR  1969   rt ing  . [place]  2009   rt shoulder scope  . TONSILLECTOMY  AS CHILD   Reviewed with family.   reports that she has never smoked. She has never used smokeless tobacco. She reports that she does not drink alcohol or use drugs.  Allergies  Allergen Reactions  . Aspirin Other (See Comments)    Stomach bleed    History reviewed. No pertinent  family history.  Prior to Admission medications   Medication Sig Start Date End Date Taking? Authorizing Provider  acetaminophen (TYLENOL) 500 MG tablet Take 1,000 mg by mouth 2 (two) times daily.    Yes [provider]  ferrous sulfate (IRON SUPPLEMENT) 325 (65 FE) MG tablet Take 325 mg by mouth daily with breakfast.   Yes [provider]  meloxicam (MOBIC) 7.5 MG tablet Take 7.5 mg by mouth every morning.   Yes [provider]  Menthol-Methyl Salicylate (MUSCLE RUB) 10-15 % CREA Apply 1 application topically as needed for muscle pain.   Yes [provider]  traMADol (ULTRAM) 50 MG tablet Take 50 mg by mouth 2 (two) times daily as needed for moderate pain.     Yes [provider]  atenolol (TENORMIN) 100 MG tablet Take 100 mg by mouth every morning.     [provider]  doxazosin (CARDURA) 4 MG tablet Take 4 mg by mouth at bedtime.     [provider]  furosemide (LASIX) 40 MG tablet Take 40 mg by mouth daily. May take an additional 40mg s daily as needed for swelling    [provider]  hydrALAZINE (APRESOLINE) 25 MG tablet Take 50 mg by mouth 2 (two) times daily.  05/04/13   [provider]  levothyroxine (SYNTHROID, LEVOTHROID) 125 MCG tablet Take 125 mcg by mouth daily.    [provider]  lisinopril (PRINIVIL,ZESTRIL) 40 MG tablet Take 40 mg by mouth every morning.     [provider]    Physical Exam: Vitals:   12/20/17 0830 12/20/17 0900 12/20/17 1133 12/20/17 1324  BP: 117/60 (!) 110/58 101/60 97/60  Pulse: 81 79 73 73  Resp:   16 18  Temp:   98.8 F (37.1 C) 99 F (37.2 C)  TempSrc:   Oral Oral  SpO2: 95% 95% 97% 96%  Weight:   59 kg   Height:   5' (1.524 m)     Constitutional: Elderly woman, sitting in chair, NAD Eyes:  She has conjunctival injection, chronic changes to sclerae with heaped up section of lateral sclera of the left eye.  ENMT: Mucous membranes are mildly dry Neck: normal, supple, significant kyphosis Respiratory: CTAB, breathing comfortably, no wheezing Cardiovascular: RR, + murmur heard best at LUSB Abdomen: + tenderness to palpation, epigastrium and RUQ.  She has some distention, she is sitting in chair, unable to lie flat due to kyphosis and back pain  Musculoskeletal: no clubbing or cyanosis.  She has unna boots to bilateral LE Skin: thin skin, bruising, no rashes on exposed skin.  Neurologic: Grossly intact, moving all extremities, very hard of hearing.  Psychiatric:  Alert and oriented x 3. Normal mood.    Labs on Admission: I have personally reviewed following labs and imaging studies  CBC: Recent Labs  Lab 12/19/17 2300  WBC 11.5*  HGB  9.0*  HCT 28.7*  MCV 96.3  PLT 716   Basic Metabolic Panel: Recent Labs  Lab 12/19/17 2300  NA 131*  K 3.8  CL 98  CO2 25  GLUCOSE 132*  BUN 18  CREATININE 1.25*  CALCIUM 8.0*   GFR: Estimated Creatinine Clearance: 24.5 mL/min (A) (by C-G formula based on SCr of 1.25 mg/dL (H)). Liver Function Tests: Recent Labs  Lab 12/19/17 2300  AST 15  ALT 8  ALKPHOS 118  BILITOT 0.5  PROT 5.5*  ALBUMIN 2.9*   Recent Labs  Lab 12/19/17 2300  LIPASE 25   No  results for input(s): AMMONIA in the last 168 hours. Coagulation Profile: No results for input(s): INR, PROTIME in the last 168 hours. Cardiac Enzymes: No results for input(s): CKTOTAL, CKMB, CKMBINDEX, TROPONINI in the last 168 hours. BNP (last 3 results) No results for input(s): PROBNP in the last 8760 hours. HbA1C: No results for input(s): HGBA1C in the last 72 hours. CBG: No results for input(s): GLUCAP in the last 168 hours. Lipid Profile: No results for input(s): CHOL, HDL, LDLCALC, TRIG, CHOLHDL, LDLDIRECT in the last 72 hours. Thyroid Function Tests: No results for input(s): TSH, T4TOTAL, FREET4, T3FREE, THYROIDAB in the last 72 hours. Anemia Panel: No results for input(s): VITAMINB12, FOLATE, FERRITIN, TIBC, IRON, RETICCTPCT in the last 72 hours. Urine analysis:    Component Value Date/Time   COLORURINE YELLOW 12/19/2017 2303   APPEARANCEUR CLEAR 12/19/2017 2303   LABSPEC 1.015 12/19/2017 2303   PHURINE 5.0 12/19/2017 2303   GLUCOSEU NEGATIVE 12/19/2017 2303   HGBUR NEGATIVE 12/19/2017 2303   BILIRUBINUR NEGATIVE 12/19/2017 2303   KETONESUR NEGATIVE 12/19/2017 2303   PROTEINUR 100 (A) 12/19/2017 2303   NITRITE NEGATIVE 12/19/2017 2303   LEUKOCYTESUR NEGATIVE 12/19/2017 2303    Radiological Exams on Admission: Ct Abdomen Pelvis W Contrast  Result Date: 12/20/2017 CLINICAL DATA:  Lower abdominal pain for 2 days with symptoms of constipation EXAM: CT ABDOMEN AND PELVIS WITH CONTRAST TECHNIQUE:  Multidetector CT imaging of the abdomen and pelvis was performed using the standard protocol following bolus administration of intravenous contrast. CONTRAST:  54mL OMNIPAQUE IOHEXOL 300 MG/ML  SOLN COMPARISON:  02/04/2010 FINDINGS: Lower chest: Lung bases demonstrate bilateral infiltrative opacities. Small effusions are noted bilaterally. Additionally there is a somewhat more rounded area of increased density in the medial aspect of the left lower lobe. This measures 2.2 cm in transverse diameter and previously measured approximately 11 mm in transverse diameter. This has previously show no significant metabolic activity although the increase in size is somewhat suspicious. Short-term follow-up in 6 months is recommended. Hepatobiliary: Gallbladder is well distended with evidence of cholelithiasis. Some questionable pericholecystic fluid is noted. No wall thickening is seen. The liver is within normal limits Pancreas: Pancreas is well visualized. No definitive mass lesion is noted. Spleen: Normal in size without focal abnormality. Adrenals/Urinary Tract: Adrenal glands are stable in appearance with mild thickening. The kidneys demonstrate bilateral renal cystic change. No obstructive changes are noted. Bladder is partially distended. Stomach/Bowel: Scattered diverticular change of the colon is noted without evidence of diverticulitis. Moderate-sized hiatal hernia is noted increased when compare with the prior exam. Some foci of free air are noted adjacent to the liver as well as the first portion of the duodenum. Some indistinct tissue planes are noted in this region as well in the possibility of a perforated ulcer could not be totally excluded. Vascular/Lymphatic: Aortic atherosclerosis. No enlarged abdominal or pelvic lymph nodes. Reproductive: Status post hysterectomy. No adnexal masses. Other: No abdominal wall hernia or abnormality. No abdominopelvic ascites. Musculoskeletal: Bilateral superior and inferior  pubic rami fractures are noted likely related to the patient's given clinical history of recent fall. Bilateral sacral fractures are noted as well. Only minimal displacement at the fracture sites is seen. There is a right transverse process fracture at L5 no rib abnormalities are noted. Severe degenerative changes of lumbar spine are noted without definitive compression deformity. IMPRESSION: Minimal free air in the upper abdomen adjacent to the liver some indistinct tissue planes are noted in the region of the proximal duodenum. The possibility of  a minimally perforated duodenal ulcer could not be totally excluded. Multiple pelvic fractures as described above consistent with the patient's given clinical history of recent fall. Increase in size of soft tissue nodule within the medial aspect of the left lung base when compared with previous exams dating back to 2011. This previously showed no significant metabolic activity on PET-CT. The increase in size is somewhat suspicious however and follow-up examination in 3-6 months is recommended Bilateral lower lobe infiltrative changes. Small effusions are noted as well. Cholelithiasis with some suggestion of pericholecystic fluid. This may be related to the adjacent duodenal changes. Critical Value/emergent results were called by telephone at the time of interpretation on 12/20/2017 at 6:25 am to Dr. Pryor Curia , who verbally acknowledged these results. Electronically Signed   By: Inez Catalina M.D.   On: 12/20/2017 06:24      Assessment/Plan  Duodenal ulcer perforation - Complete bowel rest, no oral medications or sips - IV PPI on national shortage, will treat with IV pepcid BID - discussed with pharmacy - Zosyn per pharmacy consult - Abdominal evaluation twice per day - Transition medications to IV formulations - Reviewed Gen Surg note, by Dr. Grandville Silos - IVF with NS at 75cc/hr - Low dose Morphine for pain, IV  Pelvic Fracture - Orthopedics consulted,  reviewed note, she is WBAT bilaterally  Hypothyroidism - Change to IV synthroid, half of home dose, 5mcg IV daily  Spinal stenosis/arthritis - Pain control with low dose morphine prn - Needs bowel regimen PR  HTN - Monitor pressures, hold oral medications - IV hydralazine 2mg  for SBP > 180  Elevated Cr - Trend after fluids, BMET in the AM  Leukocytosis, anemia - Both appear chronic, but labs in our system are from 8 years ago, no labs in care everywhere - Monitor   DVT prophylaxis: heparin sq Code Status: Full, discussed with son.  They would prefer to discuss further with their mother, so request it not be addressed by team.  Family Communication: Son at bedside Disposition Plan: Admit for monitoring, NPO Consults called: General Surgery, Dr. Grandville Silos; Orthopedics Admission status: Admit, inpatient, medsurg   Gilles Chiquito MD [facility] Pager [phone]  If 7PM-7AM, please contact night-coverage [email or web address] Password [facility]  12/20/2017, 4:56 PM

## 2017-12-20 NOTE — Consult Note (Signed)
Reason for Consult:free air Referring Physician: Jerilynn Mages. [name] is an 84 y.o. female.  HPI: 84 year old female with a history of dementia and multiple medical problems presented to the emergency department for evaluation of epigastric abdominal pain with acute onset.  Of note, she is status post esophageal dilatation by Dr. Therisa Doyne in August 2018.  She underwent further work-up in the emergency department which included a CT scan of the abdomen and pelvis.  This showed a small focus of free air between her proximal duodenum and her liver and we were asked to see her in consultation.  Her daughter is with her and reports that the patient lives alone, however, the patient's son has been living with her recently and a nephew lives very close by.  Her daughter lives in [place].  She additionally reports weeping from her bilateral lower extremities.  Dr. [name] is her primary care physician and they have been treating that with dressings and wraps.  The patient reports her upper abdominal pain has improved but has not completely gone away.  She is a poor historian due to dementia.  That she felt by sitting down hard in the kitchen in August.  She was evaluated by her primary care physician with x-rays which were unrevealing.  She continued to have pain in her lower back and pelvic area and she saw Dr. Melrose Nakayama and was diagnosed with a pelvic fracture.  She is continuing to have pain and her daughter requests follow-up from orthopedics at this time as well.  The area of concern is visualized on her CT scan of the abdomen and pelvis.  Past Medical History:  Diagnosis Date  . Arthritis    All over  . Back pain   . Cancer [facility]) 1996   breast cancer, left   . Edema leg   . Heart murmur    SLIGHT  . Hyperlipidemia   . Hypertension   . Hypothyroidism   . Spinal stenosis   . Thyroid disease   . Wears hearing aid    right    Past Surgical History:  Procedure Laterality Date  .  ABDOMINAL HYSTERECTOMY     PARTIAL  . BREAST SURGERY  1996   lt lumpectomy-ca RADIATION DONE  . CARPAL TUNNEL RELEASE  2012   left  . CARPAL TUNNEL RELEASE Right 04/30/2013   Procedure: RIGHT CARPAL TUNNEL RELEASE;  Surgeon: Cammie Sickle., MD;  Location: [facility];  Service: Orthopedics;  Laterality: Right;  . COLONOSCOPY    . ERCP  2007  . ESOPHAGOGASTRODUODENOSCOPY N/A 10/09/2016   Procedure: ESOPHAGOGASTRODUODENOSCOPY (EGD);  Surgeon: Ronnette Juniper, MD;  Location: [facility];  Service: Gastroenterology;  Laterality: N/A;  . ESOPHAGOGASTRODUODENOSCOPY (EGD) WITH PROPOFOL N/A 11/01/2014   Procedure: ESOPHAGOGASTRODUODENOSCOPY (EGD) WITH PROPOFOL;  Surgeon: Garlan Fair, MD;  Location: [facility];  Service: Endoscopy;  Laterality: N/A;  Dr Wynetta Emery also wants the pt to have a Fluroscopy.  Marland Kitchen EYE SURGERY     both cataracts  . HERNIA REPAIR  1969   rt ing  . [place]  2009   rt shoulder scope  . TONSILLECTOMY  AS CHILD    No family history on file.  Social History:  reports that she has never smoked. She has never used smokeless tobacco. She reports that she does not drink alcohol or use drugs.  Allergies: No Known Allergies  Medications: I have reviewed the patient's current medications.  Results for orders placed or  performed during the hospital encounter of 12/19/17 (from the past 48 hour(s))  Lipase, blood     Status: None   Collection Time: 12/19/17 11:00 PM  Result Value Ref Range   Lipase 25 11 - 51 U/L    Comment: Performed at [facility], [address], [place], [place] [address]  Comprehensive metabolic panel     Status: Abnormal   Collection Time: 12/19/17 11:00 PM  Result Value Ref Range   Sodium 131 (L) 135 - 145 mmol/L   Potassium 3.8 3.5 - 5.1 mmol/L   Chloride 98 98 - 111 mmol/L   CO2 25 22 - 32 mmol/L   Glucose, Bld 132 (H) 70 - 99 mg/dL   BUN 18 8 - 23 mg/dL   Creatinine, Ser 1.25 (H) 0.44 - 1.00 mg/dL   Calcium 8.0  (L) 8.9 - 10.3 mg/dL   Total Protein 5.5 (L) 6.5 - 8.1 g/dL   Albumin 2.9 (L) 3.5 - 5.0 g/dL   AST 15 15 - 41 U/L   ALT 8 0 - 44 U/L   Alkaline Phosphatase 118 38 - 126 U/L   Total Bilirubin 0.5 0.3 - 1.2 mg/dL   GFR calc non Af Amer 37 (L) >60 mL/min   GFR calc Af Amer 43 (L) >60 mL/min    Comment: (NOTE) The eGFR has been calculated using the CKD EPI equation. This calculation has not been validated in all clinical situations. eGFR's persistently <60 mL/min signify possible Chronic Kidney Disease.    Anion gap 8 5 - 15    Comment: Performed at [facility] [address], [place], [place] [address]  CBC     Status: Abnormal   Collection Time: 12/19/17 11:00 PM  Result Value Ref Range   WBC 11.5 (H) 4.0 - 10.5 K/uL   RBC 2.98 (L) 3.87 - 5.11 MIL/uL   Hemoglobin 9.0 (L) 12.0 - 15.0 g/dL   HCT 28.7 (L) 36.0 - 46.0 %   MCV 96.3 78.0 - 100.0 fL   MCH 30.2 26.0 - 34.0 pg   MCHC 31.4 30.0 - 36.0 g/dL   RDW 15.9 (H) 11.5 - 15.5 %   Platelets 327 150 - 400 K/uL    Comment: Performed at [facility] [address], [place], [place] [address]  Urinalysis, Routine w reflex microscopic     Status: Abnormal   Collection Time: 12/19/17 11:03 PM  Result Value Ref Range   Color, Urine YELLOW YELLOW   APPearance CLEAR CLEAR   Specific Gravity, Urine 1.015 1.005 - 1.030   pH 5.0 5.0 - 8.0   Glucose, UA NEGATIVE NEGATIVE mg/dL   Hgb urine dipstick NEGATIVE NEGATIVE   Bilirubin Urine NEGATIVE NEGATIVE   Ketones, ur NEGATIVE NEGATIVE mg/dL   Protein, ur 100 (A) NEGATIVE mg/dL   Nitrite NEGATIVE NEGATIVE   Leukocytes, UA NEGATIVE NEGATIVE   RBC / HPF 0-5 0 - 5 RBC/hpf   WBC, UA 0-5 0 - 5 WBC/hpf   Bacteria, UA RARE (A) NONE SEEN   Squamous Epithelial / LPF 0-5 0 - 5   Mucus PRESENT    Hyaline Casts, UA PRESENT     Comment: Performed at [facility] [address] [place], [place] [address]    Ct Abdomen Pelvis W Contrast  Result Date: 12/20/2017 CLINICAL DATA:   Lower abdominal pain for 2 days with symptoms of constipation EXAM: CT ABDOMEN AND PELVIS WITH CONTRAST TECHNIQUE: Multidetector CT imaging of  the abdomen and pelvis was performed using the standard protocol following bolus administration of intravenous contrast. CONTRAST:  66m OMNIPAQUE IOHEXOL 300 MG/ML  SOLN COMPARISON:  02/04/2010 FINDINGS: Lower chest: Lung bases demonstrate bilateral infiltrative opacities. Small effusions are noted bilaterally. Additionally there is a somewhat more rounded area of increased density in the medial aspect of the left lower lobe. This measures 2.2 cm in transverse diameter and previously measured approximately 11 mm in transverse diameter. This has previously show no significant metabolic activity although the increase in size is somewhat suspicious. Short-term follow-up in 6 months is recommended. Hepatobiliary: Gallbladder is well distended with evidence of cholelithiasis. Some questionable pericholecystic fluid is noted. No wall thickening is seen. The liver is within normal limits Pancreas: Pancreas is well visualized. No definitive mass lesion is noted. Spleen: Normal in size without focal abnormality. Adrenals/Urinary Tract: Adrenal glands are stable in appearance with mild thickening. The kidneys demonstrate bilateral renal cystic change. No obstructive changes are noted. Bladder is partially distended. Stomach/Bowel: Scattered diverticular change of the colon is noted without evidence of diverticulitis. Moderate-sized hiatal hernia is noted increased when compare with the prior exam. Some foci of free air are noted adjacent to the liver as well as the first portion of the duodenum. Some indistinct tissue planes are noted in this region as well in the possibility of a perforated ulcer could not be totally excluded. Vascular/Lymphatic: Aortic atherosclerosis. No enlarged abdominal or pelvic lymph nodes. Reproductive: Status post hysterectomy. No adnexal masses. Other: No  abdominal wall hernia or abnormality. No abdominopelvic ascites. Musculoskeletal: Bilateral superior and inferior pubic rami fractures are noted likely related to the patient's given clinical history of recent fall. Bilateral sacral fractures are noted as well. Only minimal displacement at the fracture sites is seen. There is a right transverse process fracture at L5 no rib abnormalities are noted. Severe degenerative changes of lumbar spine are noted without definitive compression deformity. IMPRESSION: Minimal free air in the upper abdomen adjacent to the liver some indistinct tissue planes are noted in the region of the proximal duodenum. The possibility of a minimally perforated duodenal ulcer could not be totally excluded. Multiple pelvic fractures as described above consistent with the patient's given clinical history of recent fall. Increase in size of soft tissue nodule within the medial aspect of the left lung base when compared with previous exams dating back to 2011. This previously showed no significant metabolic activity on PET-CT. The increase in size is somewhat suspicious however and follow-up examination in 3-6 months is recommended Bilateral lower lobe infiltrative changes. Small effusions are noted as well. Cholelithiasis with some suggestion of pericholecystic fluid. This may be related to the adjacent duodenal changes. Critical Value/emergent results were called by telephone at the time of interpretation on 12/20/2017 at 6:25 am to Dr. KPryor Curia, who verbally acknowledged these results. Electronically Signed   By: MInez CatalinaM.D.   On: 12/20/2017 06:24    Review of Systems  Constitutional: Negative for chills and fever.  HENT: Positive for hearing loss.   Eyes: Negative for blurred vision.  Respiratory: Negative for cough and shortness of breath.   Cardiovascular: Positive for leg swelling. Negative for chest pain.  Gastrointestinal: Positive for abdominal pain. Negative for  diarrhea, nausea and vomiting.  Genitourinary: Negative.   Musculoskeletal: Positive for back pain.  Skin:       Weeping from bilateral lower extremities  Neurological:       Poor memory  Endo/Heme/Allergies: Negative.  Psychiatric/Behavioral: Positive for memory loss.   Blood pressure (!) 109/53, pulse 81, temperature 97.9 F (36.6 C), temperature source Oral, resp. rate 16, SpO2 96 %. Physical Exam  Constitutional: She appears well-developed. No distress.  HENT:  Head: Normocephalic.  Right Ear: External ear normal.  Left Ear: External ear normal.  Nose: Nose normal.  Mouth/Throat: Oropharynx is clear and moist.  Eyes: Pupils are equal, round, and reactive to light. EOM are normal. Right eye exhibits no discharge. Left eye exhibits no discharge. No scleral icterus.  Neck: Neck supple. No tracheal deviation present.  Cardiovascular: Normal rate, regular rhythm and normal heart sounds.  Respiratory: Effort normal and breath sounds normal. No respiratory distress. She has no wheezes.  Scant Rales  GI: Soft. She exhibits no distension. There is tenderness. There is no rebound and no guarding.  Epigastric tenderness without peritoneal signs, no generalized tenderness, bowel sounds are present  Musculoskeletal:  Bilateral lower extremities with wraps    Assessment/Plan: Likely contained perforation of duodenal ulcer - while it is also possible she had a small perforation near her GE junction in light of her history of esophageal stricture, no emergent surgical intervention is required.  Recommend IV fluids, complete bowel rest, IV Zosyn, and IV proton pump inhibitors.  We will follow closely and I anticipate, should she continue to improve clinically, we will follow-up with an upper GI in 2-3 days to ensure this area has sealed.  I had a long talk with her daughter, should the patient deteriorate clinically we would at that time need to address goals of care.  It is unlikely surgical  intervention would be in this patient's best interest overall.  L5 transverse process fracture, bilateral sacral fracture, pubic rami fractures - subacute with history of fall last month.  I have requested orthopedic surgery consult.  Significant bilateral venous stasis disease - WOC evaluation per primary  Multiple medical problems - admit to hospitalist service  Zenovia Jarred 12/20/2017, 8:34 AM

## 2017-12-20 NOTE — ED Notes (Signed)
MD Grandville Silos at bedside.

## 2017-12-20 NOTE — Progress Notes (Signed)
Pharmacy Antibiotic Note  MEGON KALINA is a 84 y.o. female admitted on 12/19/2017 with intra-abdominal infection.  Pharmacy has been consulted for zosyn dosing. Pt is afebrile and WBC is elevated at 11.5. SCr is mildly elevated at 1.25.   Plan: Zosyn 3.375gm IV Q8H (4 hr inf) F/u renal fxn, C&S, clinical status      Temp (24hrs), Avg:97.9 F (36.6 C), Min:97.9 F (36.6 C), Max:97.9 F (36.6 C)  Recent Labs  Lab 12/19/17 2300  WBC 11.5*  CREATININE 1.25*    CrCl cannot be calculated (Unknown ideal weight.).    No Known Allergies  Antimicrobials this admission: Zosyn 9/27>>  Dose adjustments this admission: N/A  Microbiology results: Pending  Thank you for allowing pharmacy to be a part of this patient's care.  Loman Logan, Rande Lawman 12/20/2017 10:14 AM

## 2017-12-20 NOTE — Progress Notes (Signed)
[name] is a 84 y.o. female patient admitted from ED awake, alert - oriented  X 4 - no acute distress noted.  VSS - Blood pressure 101/60, pulse 73, temperature 98.8 F (37.1 C), temperature source Oral, resp. rate 16, SpO2 97 %.    IV in place, occlusive dsg intact without redness.  Orientation to room, and floor completed with information packet given to patient/family.  Patient declined safety video at this time.  Admission INP armband ID verified with patient/family, and in place.   SR up x 2, fall assessment complete, with patient and family able to verbalize understanding of risk associated with falls, and verbalized understanding to call nsg before up out of bed.  Call light within reach, patient able to voice, and demonstrate understanding.  Skin, clean-dry- intact without evidence of bruising, or skin tears.   No evidence of skin break down noted on exam.     Will cont to eval and treat per MD orders.  Luci Bank, RN 12/20/2017 11:52 AM

## 2017-12-20 NOTE — ED Notes (Signed)
ED Provider at bedside. 

## 2017-12-20 NOTE — ED Notes (Signed)
Admitting MD at bedside.

## 2017-12-20 NOTE — Consult Note (Signed)
Reason for Consult:Pelvic fxs Referring Physician: B [name] is an 84 y.o. female.  HPI: [name] came to the ED with a weeklong hx/o abd pain. She was diagnosed with a contained duodenal ulcer perf. She and family had questions about her previous diagnosed pelvic fxs and orthopedic surgery was consulted. She fell back in early August and has been to see Dr. Rhona Schroeder in the office. He noted what sounds like a stage 1 sacral pressure ulcer. According to the pt her home health aides have said it hasn't progressed. She has continued to be ambulatory with her walker.   Past Medical History:  Diagnosis Date  . Arthritis    All over  . Back pain   . Cancer [facility]) 1996   breast cancer, left   . Edema leg   . Heart murmur    SLIGHT  . Hyperlipidemia   . Hypertension   . Hypothyroidism   . Spinal stenosis   . Thyroid disease   . Wears hearing aid    right    Past Surgical History:  Procedure Laterality Date  . ABDOMINAL HYSTERECTOMY     PARTIAL  . BREAST SURGERY  1996   lt lumpectomy-ca RADIATION DONE  . CARPAL TUNNEL RELEASE  2012   left  . CARPAL TUNNEL RELEASE Right 04/30/2013   Procedure: RIGHT CARPAL TUNNEL RELEASE;  Surgeon: Cammie Sickle., MD;  Location: [place];  Service: Orthopedics;  Laterality: Right;  . COLONOSCOPY    . ERCP  2007  . ESOPHAGOGASTRODUODENOSCOPY N/A 10/09/2016   Procedure: ESOPHAGOGASTRODUODENOSCOPY (EGD);  Surgeon: Ronnette Juniper, MD;  Location: [facility];  Service: Gastroenterology;  Laterality: N/A;  . ESOPHAGOGASTRODUODENOSCOPY (EGD) WITH PROPOFOL N/A 11/01/2014   Procedure: ESOPHAGOGASTRODUODENOSCOPY (EGD) WITH PROPOFOL;  Surgeon: Garlan Fair, MD;  Location: [facility];  Service: Endoscopy;  Laterality: N/A;  Dr Wynetta Emery also wants the pt to have a Fluroscopy.  [facility] Kitchen EYE SURGERY     both cataracts  . HERNIA REPAIR  1969   rt ing  . [place]  2009   rt shoulder scope  . TONSILLECTOMY  AS CHILD     No family history on file.  Social History:  reports that she has never smoked. She has never used smokeless tobacco. She reports that she does not drink alcohol or use drugs.  Allergies: No Known Allergies  Medications: I have reviewed the patient's current medications.  Results for orders placed or performed during the hospital encounter of 12/19/17 (from the past 48 hour(s))  Lipase, blood     Status: None   Collection Time: 12/19/17 11:00 PM  Result Value Ref Range   Lipase 25 11 - 51 U/L    Comment: Performed at [facility], [place] [address] [place], [place] [address]  Comprehensive metabolic panel     Status: Abnormal   Collection Time: 12/19/17 11:00 PM  Result Value Ref Range   Sodium 131 (L) 135 - 145 mmol/L   Potassium 3.8 3.5 - 5.1 mmol/L   Chloride 98 98 - 111 mmol/L   CO2 25 22 - 32 mmol/L   Glucose, Bld 132 (H) 70 - 99 mg/dL   BUN 18 8 - 23 mg/dL   Creatinine, Ser 1.25 (H) 0.44 - 1.00 mg/dL   Calcium 8.0 (L) 8.9 - 10.3 mg/dL   Total Protein 5.5 (L) 6.5 - 8.1 g/dL   Albumin 2.9 (L) 3.5 - 5.0 g/dL   AST 15 15 - 41  U/L   ALT 8 0 - 44 U/L   Alkaline Phosphatase 118 38 - 126 U/L   Total Bilirubin 0.5 0.3 - 1.2 mg/dL   GFR calc non Af Amer 37 (L) >60 mL/min   GFR calc Af Amer 43 (L) >60 mL/min    Comment: (NOTE) The eGFR has been calculated using the CKD EPI equation. This calculation has not been validated in all clinical situations. eGFR's persistently <60 mL/min signify possible Chronic Kidney Disease.    Anion gap 8 5 - 15    Comment: Performed at [facility] [address], [place], [place] [address]  CBC     Status: Abnormal   Collection Time: 12/19/17 11:00 PM  Result Value Ref Range   WBC 11.5 (H) 4.0 - 10.5 K/uL   RBC 2.98 (L) 3.87 - 5.11 MIL/uL   Hemoglobin 9.0 (L) 12.0 - 15.0 g/dL   HCT 28.7 (L) 36.0 - 46.0 %   MCV 96.3 78.0 - 100.0 fL   MCH 30.2 26.0 - 34.0 pg   MCHC 31.4 30.0 - 36.0 g/dL   RDW 15.9 (H) 11.5 - 15.5 %    Platelets 327 150 - 400 K/uL    Comment: Performed at [facility] [address] [place], [place] [address]  Urinalysis, Routine w reflex microscopic     Status: Abnormal   Collection Time: 12/19/17 11:03 PM  Result Value Ref Range   Color, Urine YELLOW YELLOW   APPearance CLEAR CLEAR   Specific Gravity, Urine 1.015 1.005 - 1.030   pH 5.0 5.0 - 8.0   Glucose, UA NEGATIVE NEGATIVE mg/dL   Hgb urine dipstick NEGATIVE NEGATIVE   Bilirubin Urine NEGATIVE NEGATIVE   Ketones, ur NEGATIVE NEGATIVE mg/dL   Protein, ur 100 (A) NEGATIVE mg/dL   Nitrite NEGATIVE NEGATIVE   Leukocytes, UA NEGATIVE NEGATIVE   RBC / HPF 0-5 0 - 5 RBC/hpf   WBC, UA 0-5 0 - 5 WBC/hpf   Bacteria, UA RARE (A) NONE SEEN   Squamous Epithelial / LPF 0-5 0 - 5   Mucus PRESENT    Hyaline Casts, UA PRESENT     Comment: Performed at [facility] [address], [place], [place] [address]    Ct Abdomen Pelvis W Contrast  Result Date: 12/20/2017 CLINICAL DATA:  Lower abdominal pain for 2 days with symptoms of constipation EXAM: CT ABDOMEN AND PELVIS WITH CONTRAST TECHNIQUE: Multidetector CT imaging of the abdomen and pelvis was performed using the standard protocol following bolus administration of intravenous contrast. CONTRAST:  67m OMNIPAQUE IOHEXOL 300 MG/ML  SOLN COMPARISON:  02/04/2010 FINDINGS: Lower chest: Lung bases demonstrate bilateral infiltrative opacities. Small effusions are noted bilaterally. Additionally there is a somewhat more rounded area of increased density in the medial aspect of the left lower lobe. This measures 2.2 cm in transverse diameter and previously measured approximately 11 mm in transverse diameter. This has previously show no significant metabolic activity although the increase in size is somewhat suspicious. Short-term follow-up in 6 months is recommended. Hepatobiliary: Gallbladder is well distended with evidence of cholelithiasis. Some questionable pericholecystic fluid is  noted. No wall thickening is seen. The liver is within normal limits Pancreas: Pancreas is well visualized. No definitive mass lesion is noted. Spleen: Normal in size without focal abnormality. Adrenals/Urinary Tract: Adrenal glands are stable in appearance with mild thickening. The kidneys demonstrate bilateral renal cystic change. No obstructive changes are noted. Bladder is partially distended. Stomach/Bowel: Scattered diverticular change  of the colon is noted without evidence of diverticulitis. Moderate-sized hiatal hernia is noted increased when compare with the prior exam. Some foci of free air are noted adjacent to the liver as well as the first portion of the duodenum. Some indistinct tissue planes are noted in this region as well in the possibility of a perforated ulcer could not be totally excluded. Vascular/Lymphatic: Aortic atherosclerosis. No enlarged abdominal or pelvic lymph nodes. Reproductive: Status post hysterectomy. No adnexal masses. Other: No abdominal wall hernia or abnormality. No abdominopelvic ascites. Musculoskeletal: Bilateral superior and inferior pubic rami fractures are noted likely related to the patient's given clinical history of recent fall. Bilateral sacral fractures are noted as well. Only minimal displacement at the fracture sites is seen. There is a right transverse process fracture at L5 no rib abnormalities are noted. Severe degenerative changes of lumbar spine are noted without definitive compression deformity. IMPRESSION: Minimal free air in the upper abdomen adjacent to the liver some indistinct tissue planes are noted in the region of the proximal duodenum. The possibility of a minimally perforated duodenal ulcer could not be totally excluded. Multiple pelvic fractures as described above consistent with the patient's given clinical history of recent fall. Increase in size of soft tissue nodule within the medial aspect of the left lung base when compared with previous  exams dating back to 2011. This previously showed no significant metabolic activity on PET-CT. The increase in size is somewhat suspicious however and follow-up examination in 3-6 months is recommended Bilateral lower lobe infiltrative changes. Small effusions are noted as well. Cholelithiasis with some suggestion of pericholecystic fluid. This may be related to the adjacent duodenal changes. Critical Value/emergent results were called by telephone at the time of interpretation on 12/20/2017 at 6:25 am to Dr. Pryor Curia , who verbally acknowledged these results. Electronically Signed   By: Inez Catalina M.D.   On: 12/20/2017 06:24    Review of Systems  Constitutional: Negative for weight loss.  HENT: Negative for ear discharge, ear pain, hearing loss and tinnitus.   Eyes: Negative for blurred vision, double vision, photophobia and pain.  Respiratory: Negative for cough, sputum production and shortness of breath.   Cardiovascular: Negative for chest pain.  Gastrointestinal: Positive for abdominal pain. Negative for nausea and vomiting.  Genitourinary: Negative for dysuria, flank pain, frequency and urgency.  Musculoskeletal: Positive for back pain and joint pain (Pelvis). Negative for falls, myalgias and neck pain.  Neurological: Negative for dizziness, tingling, sensory change, focal weakness, loss of consciousness and headaches.  Endo/Heme/Allergies: Does not bruise/bleed easily.  Psychiatric/Behavioral: Negative for depression, memory loss and substance abuse. The patient is not nervous/anxious.    Blood pressure (!) 110/58, pulse 79, temperature 97.9 F (36.6 C), temperature source Oral, resp. rate 16, SpO2 95 %. Physical Exam  Constitutional: She appears well-developed and well-nourished. No distress.  HENT:  Head: Normocephalic and atraumatic.  Eyes: Conjunctivae are normal. Right eye exhibits no discharge. Left eye exhibits no discharge. No scleral icterus.  Neck: Normal range of motion.   Cardiovascular: Normal rate and regular rhythm.  Respiratory: Effort normal. No respiratory distress.  Neurological: She is alert.  Skin: Skin is warm and dry. She is not diaphoretic.  Psychiatric: She has a normal mood and affect. Her behavior is normal.    Assessment/Plan: Multiple pelvic fxs -- No additional treatment or precautions needed. I answered all questions of pt/son. She may WBAT BLE. She is in a chair currently but have stressed to nursing that  her sacrum needs to be evaluated once she gets upstairs and can be examined more easily. She should keep her outpatient appointment with Dr. Rhona Schroeder once she is discharged.    Lisette Abu, PA-C Orthopedic Surgery [phone] 12/20/2017, 10:11 AM

## 2017-12-20 NOTE — ED Provider Notes (Signed)
TIME SEEN: 4:00 AM  CHIEF COMPLAINT: Abdominal pain  HPI: Patient is an 84 year old female with history of hypertension, hyperlipidemia, hypothyroidism, hard of hearing, previous left breast cancer who presents to the emergency department with abdominal pain for the past 4 days.  No fevers, nausea, vomiting, diarrhea, bloody stool, melena, dysuria, hematuria, vaginal bleeding or discharge.  She has had previous hernia repair and hysterectomy.  Last bowel movement 3 days ago.  States she takes laxative and simethicone regularly.  She has been on tramadol for chronic back pain and this makes her constipated.  States it is not abnormal for her to go 3 or 4 days without a bowel movement.  She also reports a fall the second week of August.  Denies head injury.  States she has tailbone pain.  Has been ambulating with a walker.  Lives alone.  Here with her daughter.  ROS: See HPI Constitutional: no fever  Eyes: no drainage  ENT: no runny nose   Cardiovascular:  no chest pain  Resp: no SOB  GI: no vomiting GU: no dysuria Integumentary: no rash  Allergy: no hives  Musculoskeletal: no leg swelling  Neurological: no slurred speech ROS otherwise negative  PAST MEDICAL HISTORY/PAST SURGICAL HISTORY:  Past Medical History:  Diagnosis Date  . Arthritis    All over  . Back pain   . Cancer [facility]) 1996   breast cancer, left   . Edema leg   . Heart murmur    SLIGHT  . Hyperlipidemia   . Hypertension   . Hypothyroidism   . Spinal stenosis   . Thyroid disease   . Wears hearing aid    right    MEDICATIONS:  Prior to Admission medications   Medication Sig Start Date End Date Taking? Authorizing Provider  acetaminophen (TYLENOL) 500 MG tablet Take 1,000 mg by mouth 2 (two) times daily as needed for moderate pain.    [provider]  atenolol (TENORMIN) 100 MG tablet Take 100 mg by mouth every morning.     [provider]  doxazosin (CARDURA) 4 MG tablet Take 4 mg by mouth at  bedtime.     [provider]  furosemide (LASIX) 40 MG tablet Take 40 mg by mouth daily. May take an additional 40mg s daily as needed for swelling    [provider]  hydrALAZINE (APRESOLINE) 25 MG tablet Take 50 mg by mouth 2 (two) times daily.  05/04/13   [provider]  levothyroxine (SYNTHROID, LEVOTHROID) 125 MCG tablet Take 125 mcg by mouth daily.    [provider]  lisinopril (PRINIVIL,ZESTRIL) 40 MG tablet Take 40 mg by mouth every morning.     [provider]  Menthol-Methyl Salicylate (MUSCLE RUB) 10-15 % CREA Apply 1 application topically as needed for muscle pain.    [provider]  traMADol (ULTRAM) 50 MG tablet Take 50 mg by mouth 2 (two) times daily as needed for moderate pain.     [provider]    ALLERGIES:  No Known Allergies  SOCIAL HISTORY:  Social History   Tobacco Use  . Smoking status: Never Smoker  . Smokeless tobacco: Never Used  Substance Use Topics  . Alcohol use: No    FAMILY HISTORY: No family history on file.  EXAM: BP (!) 146/86   Pulse 92   Temp 97.9 F (36.6 C) (Oral)   Resp 16   SpO2 96%  CONSTITUTIONAL: Alert and oriented and responds appropriately to questions. Elderly HEAD: Normocephalic EYES:  Conjunctivae clear, pupils appear equal, EOMI ENT: normal nose; moist mucous membranes NECK: Supple, no meningismus, no nuchal rigidity, no LAD  CARD: RRR; S1 and S2 appreciated; no murmurs, no clicks, no rubs, no gallops RESP: Normal chest excursion without splinting or tachypnea; breath sounds clear and equal bilaterally; no wheezes, no rhonchi, no rales, no hypoxia or respiratory distress, speaking full sentences ABD/GI: Normal bowel sounds; non-distended; soft, TTP diffusely, no rebound, no guarding, no peritoneal signs, no hepatosplenomegaly BACK:  The back appears normal and is non-tender to palpation, there is no CVA tenderness; kyphotic EXT: Normal ROM in all joints; non-tender  to palpation; no edema; normal capillary refill; no cyanosis, no calf tenderness or swelling    SKIN: Normal color for age and race; warm; no rash NEURO: Moves all extremities equally PSYCH: The patient's mood and manner are appropriate. Grooming and personal hygiene are appropriate.  MEDICAL DECISION MAKING: Patient here with abdominal pain.  Differential diagnosis includes colitis, diverticulitis, appendicitis, peptic ulcer disease, cholelithiasis, pancreatitis.  She is diffusely tender on examination.  Labs in triage show mild leukocytosis.  LFTs, lipase within normal limits.  Will obtain urinalysis and a CT of her abdomen pelvis.  She declines pain medicine at this time.  ED PROGRESS: CT scan shows minimal free air in the upper abdomen adjacent to the liver concerning for minimally perforated duodenal ulcer.  Will start on antibiotics and PPI.  She also has multiple pelvic fractures including bilateral superior and inferior pubic rami fractures, bilateral sacral fractures, right transverse process fracture at L5.  Patient's fall was 6 weeks ago.  She has been ambulating with a walker.  Will discuss with general surgery for their recommendations.   7:02 AM Discussed patient's case with general surgeon, Dr. Dema Severin.  I have recommended admission and patient (and family if present) agree with this plan. Surgery to see in ED  I reviewed all nursing notes, vitals, pertinent previous records, EKGs, lab and urine results, imaging (as available).      Burech Mcfarland, Delice Bison, DO 12/20/17 [phone]

## 2017-12-21 DIAGNOSIS — I10 Essential (primary) hypertension: Secondary | ICD-10-CM

## 2017-12-21 DIAGNOSIS — D649 Anemia, unspecified: Secondary | ICD-10-CM

## 2017-12-21 LAB — BASIC METABOLIC PANEL
Anion gap: 9 (ref 5–15)
BUN: 22 mg/dL (ref 8–23)
CHLORIDE: 102 mmol/L (ref 98–111)
CO2: 23 mmol/L (ref 22–32)
CREATININE: 1.2 mg/dL — AB (ref 0.44–1.00)
Calcium: 7.7 mg/dL — ABNORMAL LOW (ref 8.9–10.3)
GFR calc Af Amer: 45 mL/min — ABNORMAL LOW (ref >60)
GFR calc non Af Amer: 39 mL/min — ABNORMAL LOW (ref >60)
GLUCOSE: 60 mg/dL — AB (ref 70–99)
POTASSIUM: 3.3 mmol/L — AB (ref 3.5–5.1)
SODIUM: 134 mmol/L — AB (ref 135–145)

## 2017-12-21 LAB — CBC
HCT: 24.7 % — ABNORMAL LOW (ref 36.0–46.0)
HCT: 23.9 % — ABNORMAL LOW (ref 36.0–46.0)
Hemoglobin: 7.8 g/dL — ABNORMAL LOW (ref 12.0–15.0)
Hemoglobin: 7.6 g/dL — ABNORMAL LOW (ref 12.0–15.0)
MCH: 30.2 pg (ref 26.0–34.0)
MCH: 30.5 pg (ref 26.0–34.0)
MCHC: 31.6 g/dL (ref 30.0–36.0)
MCHC: 31.8 g/dL (ref 30.0–36.0)
MCV: 95.7 fL (ref 78.0–100.0)
MCV: 96 fL (ref 78.0–100.0)
PLATELETS: 285 10*3/uL (ref 150–400)
Platelets: 300 10*3/uL (ref 150–400)
RBC: 2.58 MIL/uL — ABNORMAL LOW (ref 3.87–5.11)
RBC: 2.49 MIL/uL — ABNORMAL LOW (ref 3.87–5.11)
RDW: 15.9 % — ABNORMAL HIGH (ref 11.5–15.5)
RDW: 15.9 % — AB (ref 11.5–15.5)
WBC: 11.3 10*3/uL — ABNORMAL HIGH (ref 4.0–10.5)
WBC: 9 10*3/uL (ref 4.0–10.5)

## 2017-12-21 LAB — HEMOGLOBIN AND HEMATOCRIT, BLOOD
HEMATOCRIT: 26 % — AB (ref 36.0–46.0)
Hemoglobin: 8.2 g/dL — ABNORMAL LOW (ref 12.0–15.0)

## 2017-12-21 MED ORDER — SODIUM CHLORIDE 0.9 % IV SOLN
80.0000 mg | Freq: Once | INTRAVENOUS | Status: AC
  Administered 2017-12-21: 80 mg via INTRAVENOUS
  Filled 2017-12-21: qty 80

## 2017-12-21 MED ORDER — PANTOPRAZOLE SODIUM 40 MG IV SOLR
40.0000 mg | Freq: Two times a day (BID) | INTRAVENOUS | Status: DC
  Filled 2017-12-21: qty 40

## 2017-12-21 MED ORDER — SODIUM CHLORIDE 0.9 % IV SOLN
8.0000 mg/h | INTRAVENOUS | Status: AC
  Administered 2017-12-21 – 2017-12-23 (×6): 8 mg/h via INTRAVENOUS
  Filled 2017-12-21 (×9): qty 80

## 2017-12-21 NOTE — Progress Notes (Signed)
Patient ID: Tacy Learn, female   DOB: 04/25/1926, 84 y.o.   MRN: [id number]       Subjective: Patient confused this morning.  No complaints.  Just wants to go back to sleep.  Objective: Vital signs in last 24 hours: Temp:  [98.6 F (37 C)-99 F (37.2 C)] 98.6 F (37 C) (09/28 0457) Pulse Rate:  [69-81] 72 (09/28 0457) Resp:  [16-18] 16 (09/28 0457) BP: (97-123)/(58-68) 118/68 (09/28 0457) SpO2:  [94 %-98 %] 98 % (09/28 0457) Weight:  [59 kg] 59 kg (09/27 1133) Last BM Date: 12/17/17  Intake/Output from previous day: 09/27 0701 - 09/28 0700 In: 579.5 [I.V.:316.7; IV Piggyback:262.9] Out: -  Intake/Output this shift: No intake/output data recorded.  PE: Heart: regular, + murmur Lungs: CTAB Abd: soft, seems NT, ND, +BS  Lab Results:  Recent Labs    12/19/17 2300 12/21/17 0331  WBC 11.5* 11.3*  HGB 9.0* 7.8*  HCT 28.7* 24.7*  PLT 327 300   BMET Recent Labs    12/19/17 2300 12/21/17 0331  NA 131* 134*  K 3.8 3.3*  CL 98 102  CO2 25 23  GLUCOSE 132* 60*  BUN 18 22  CREATININE 1.25* 1.20*  CALCIUM 8.0* 7.7*   PT/INR No results for input(s): LABPROT, INR in the last 72 hours. CMP     Component Value Date/Time   NA 134 (L) 12/21/2017 0331   K 3.3 (L) 12/21/2017 0331   CL 102 12/21/2017 0331   CO2 23 12/21/2017 0331   GLUCOSE 60 (L) 12/21/2017 0331   BUN 22 12/21/2017 0331   CREATININE 1.20 (H) 12/21/2017 0331   CALCIUM 7.7 (L) 12/21/2017 0331   PROT 5.5 (L) 12/19/2017 2300   ALBUMIN 2.9 (L) 12/19/2017 2300   AST 15 12/19/2017 2300   ALT 8 12/19/2017 2300   ALKPHOS 118 12/19/2017 2300   BILITOT 0.5 12/19/2017 2300   GFRNONAA 39 (L) 12/21/2017 0331   GFRAA 45 (L) 12/21/2017 0331   Lipase     Component Value Date/Time   LIPASE 25 12/19/2017 2300       Studies/Results: Ct Abdomen Pelvis W Contrast  Result Date: 12/20/2017 CLINICAL DATA:  Lower abdominal pain for 2 days with symptoms of constipation EXAM: CT ABDOMEN AND PELVIS WITH  CONTRAST TECHNIQUE: Multidetector CT imaging of the abdomen and pelvis was performed using the standard protocol following bolus administration of intravenous contrast. CONTRAST:  29mL OMNIPAQUE IOHEXOL 300 MG/ML  SOLN COMPARISON:  02/04/2010 FINDINGS: Lower chest: Lung bases demonstrate bilateral infiltrative opacities. Small effusions are noted bilaterally. Additionally there is a somewhat more rounded area of increased density in the medial aspect of the left lower lobe. This measures 2.2 cm in transverse diameter and previously measured approximately 11 mm in transverse diameter. This has previously show no significant metabolic activity although the increase in size is somewhat suspicious. Short-term follow-up in 6 months is recommended. Hepatobiliary: Gallbladder is well distended with evidence of cholelithiasis. Some questionable pericholecystic fluid is noted. No wall thickening is seen. The liver is within normal limits Pancreas: Pancreas is well visualized. No definitive mass lesion is noted. Spleen: Normal in size without focal abnormality. Adrenals/Urinary Tract: Adrenal glands are stable in appearance with mild thickening. The kidneys demonstrate bilateral renal cystic change. No obstructive changes are noted. Bladder is partially distended. Stomach/Bowel: Scattered diverticular change of the colon is noted without evidence of diverticulitis. Moderate-sized hiatal hernia is noted increased when compare with the prior exam. Some foci of free air are  noted adjacent to the liver as well as the first portion of the duodenum. Some indistinct tissue planes are noted in this region as well in the possibility of a perforated ulcer could not be totally excluded. Vascular/Lymphatic: Aortic atherosclerosis. No enlarged abdominal or pelvic lymph nodes. Reproductive: Status post hysterectomy. No adnexal masses. Other: No abdominal wall hernia or abnormality. No abdominopelvic ascites. Musculoskeletal: Bilateral  superior and inferior pubic rami fractures are noted likely related to the patient's given clinical history of recent fall. Bilateral sacral fractures are noted as well. Only minimal displacement at the fracture sites is seen. There is a right transverse process fracture at L5 no rib abnormalities are noted. Severe degenerative changes of lumbar spine are noted without definitive compression deformity. IMPRESSION: Minimal free air in the upper abdomen adjacent to the liver some indistinct tissue planes are noted in the region of the proximal duodenum. The possibility of a minimally perforated duodenal ulcer could not be totally excluded. Multiple pelvic fractures as described above consistent with the patient's given clinical history of recent fall. Increase in size of soft tissue nodule within the medial aspect of the left lung base when compared with previous exams dating back to 2011. This previously showed no significant metabolic activity on PET-CT. The increase in size is somewhat suspicious however and follow-up examination in 3-6 months is recommended Bilateral lower lobe infiltrative changes. Small effusions are noted as well. Cholelithiasis with some suggestion of pericholecystic fluid. This may be related to the adjacent duodenal changes. Critical Value/emergent results were called by telephone at the time of interpretation on 12/20/2017 at 6:25 am to Dr. Pryor Curia , who verbally acknowledged these results. Electronically Signed   By: Inez Catalina M.D.   On: 12/20/2017 06:24    Anti-infectives: Anti-infectives (From admission, onward)   Start     Dose/Rate Route Frequency Ordered Stop   12/20/17 1300  piperacillin-tazobactam (ZOSYN) IVPB 3.375 g     3.375 g 12.5 mL/hr over 240 Minutes Intravenous Every 8 hours 12/20/17 1011     12/20/17 0645  piperacillin-tazobactam (ZOSYN) IVPB 3.375 g     3.375 g 100 mL/hr over 30 Minutes Intravenous  Once 12/20/17 0644 12/20/17 0757        Assessment/Plan Likely contained perforation of duodenal ulcer -cont NPO -plan for swallow study tomorrow or Monday -cont abx therapy -mobilize as able L5 transverse process fracture, bilateral sacral fracture, pubic rami fractures -follow up with [name] as per ortho reqs. -mobilize as able Multiple medical problems  FEN - NPO VTE - SCDs/ heparin ID - zosyn 9/27 -->   LOS: 1 day    Henreitta Cea , [facility] 12/21/2017, 8:11 AM Pager: [phone]

## 2017-12-21 NOTE — Progress Notes (Signed)
PROGRESS NOTE                                                                                                                                                                                                             Patient Demographics:    [name], is a 84 y.o. female, DOB - Jun 07, 1926, DJT:[id number]  Admit date - 12/19/2017   Admitting Physician Sid Falcon, MD  Outpatient Primary MD for the patient is [name], MD  LOS - 1   Chief Complaint  Patient presents with  . Abdominal Pain       Brief Narrative    84 y.o. female with medical history significant of hypothyroidism, spinal stenosis/kyphosis, HTN, HLD, h/o breast cancer, arthritis who presented with abdominal pain, imaging was significant for serrated peptic ulcer.   Subjective:    [name] today has, No headache, No chest pain, he denies abdominal pain, nausea or vomiting, no significant events overnight .   Assessment  & Plan :    Active Problems:   Duodenal ulcer perforation (HCC)   Intra-abdominal free air of unknown etiology   Duodenal ulcer perforation -Management primarily by general surgery -Further recommendation is for conservative management -She remains n.p.o., continue with IV fluids, continue with IV Zosyn, I will start on IV Protonix,. -Globin dropped to 7.8 today, I will DC subcu heparin, repeat BC this afternoon, and monitor closely, transfuse to keep hemoglobin more than 7.  Pelvic Fracture - Orthopedics consulted, she is WBAT bilaterally -PT consulted  Hypothyroidism - Change to IV synthroid, half of home dose, 67mcg IV daily  Spinal stenosis/arthritis - Pain control with low dose morphine prn - Needs bowel regimen PR  HTN -Overall blood pressure acceptable, continue to hold oral meds, continue with PRN meds.  Elevated Cr - Trend after fluids, BMET in the AM  Anemia  -Tonic, but worsening, most likely in the  setting of some IV fluids, but as well she presents with peptic ulcer disease, will monitor closely and transfuse as needed .   Code Status : Full  Family Communication  : None at bedside  Disposition Plan  : pending further work up.  Consults  :  General surgery  Procedures  : None  DVT Prophylaxis  :  SCD, hold chemical anticoagulation in the setting of perforated peptic ulcer  Lab Results  Component Value Date   PLT 300 12/21/2017    Antibiotics  :   Anti-infectives (From admission, onward)   Start     Dose/Rate Route Frequency Ordered Stop   12/20/17 1300  piperacillin-tazobactam (ZOSYN) IVPB 3.375 g     3.375 g 12.5 mL/hr over 240 Minutes Intravenous Every 8 hours 12/20/17 1011     12/20/17 0645  piperacillin-tazobactam (ZOSYN) IVPB 3.375 g     3.375 g 100 mL/hr over 30 Minutes Intravenous  Once 12/20/17 0644 12/20/17 0757        Objective:   Vitals:   12/20/17 1133 12/20/17 1324 12/20/17 2040 12/21/17 0457  BP: 101/60 97/60 123/63 118/68  Pulse: 73 73 69 72  Resp: 16 18 16 16   Temp: 98.8 F (37.1 C) 99 F (37.2 C) 98.9 F (37.2 C) 98.6 F (37 C)  TempSrc: Oral Oral    SpO2: 97% 96% 96% 98%  Weight: 59 kg     Height: 5' (1.524 m)       Wt Readings from Last 3 Encounters:  12/20/17 59 kg  10/09/16 59 kg  11/01/14 60.8 kg     Intake/Output Summary (Last 24 hours) at 12/21/2017 1305 Last data filed at 12/21/2017 0654 Gross per 24 hour  Intake 433.52 ml  Output -  Net 433.52 ml     Physical Exam  Awake Alert, pleasantly confused  Symmetrical Chest wall movement, Good air movement bilaterally, CTAB RRR,No Gallops,Rubs or new Murmurs, No Parasternal Heave +ve B.Sounds, does have some epigastric tenderness, no rebound - guarding or rigidity. No Cyanosis, Clubbing or edema, No new Rash or bruise      Data Review:    CBC Recent Labs  Lab 12/19/17 2300 12/21/17 0331  WBC 11.5* 11.3*  HGB 9.0* 7.8*  HCT 28.7* 24.7*  PLT 327 300  MCV  96.3 95.7  MCH 30.2 30.2  MCHC 31.4 31.6  RDW 15.9* 15.9*    Chemistries  Recent Labs  Lab 12/19/17 2300 12/21/17 0331  NA 131* 134*  K 3.8 3.3*  CL 98 102  CO2 25 23  GLUCOSE 132* 60*  BUN 18 22  CREATININE 1.25* 1.20*  CALCIUM 8.0* 7.7*  AST 15  --   ALT 8  --   ALKPHOS 118  --   BILITOT 0.5  --    ------------------------------------------------------------------------------------------------------------------ No results for input(s): CHOL, HDL, LDLCALC, TRIG, CHOLHDL, LDLDIRECT in the last 72 hours.  No results found for: HGBA1C ------------------------------------------------------------------------------------------------------------------ No results for input(s): TSH, T4TOTAL, T3FREE, THYROIDAB in the last 72 hours.  Invalid input(s): FREET3 ------------------------------------------------------------------------------------------------------------------ No results for input(s): VITAMINB12, FOLATE, FERRITIN, TIBC, IRON, RETICCTPCT in the last 72 hours.  Coagulation profile No results for input(s): INR, PROTIME in the last 168 hours.  No results for input(s): DDIMER in the last 72 hours.  Cardiac Enzymes No results for input(s): CKMB, TROPONINI, MYOGLOBIN in the last 168 hours.  Invalid input(s): CK ------------------------------------------------------------------------------------------------------------------ No results found for: BNP  Inpatient Medications  Scheduled Meds: . levothyroxine  75 mcg Intravenous Daily  . [START ON 12/24/2017] pantoprazole  40 mg Intravenous Q12H   Continuous Infusions: . sodium chloride 75 mL/hr at 12/20/17 1202  . pantoprozole (PROTONIX) infusion 8 mg/hr (12/21/17 0848)  . piperacillin-tazobactam (ZOSYN)  IV 3.375 g (12/21/17 0543)   PRN Meds:.bisacodyl, hydrALAZINE, morphine injection, MUSCLE RUB  Micro Results No results found for this or any previous visit (from the past 240 hour(s)).  Radiology Reports Ct  Abdomen Pelvis W  Contrast  Result Date: 12/20/2017 CLINICAL DATA:  Lower abdominal pain for 2 days with symptoms of constipation EXAM: CT ABDOMEN AND PELVIS WITH CONTRAST TECHNIQUE: Multidetector CT imaging of the abdomen and pelvis was performed using the standard protocol following bolus administration of intravenous contrast. CONTRAST:  68mL OMNIPAQUE IOHEXOL 300 MG/ML  SOLN COMPARISON:  02/04/2010 FINDINGS: Lower chest: Lung bases demonstrate bilateral infiltrative opacities. Small effusions are noted bilaterally. Additionally there is a somewhat more rounded area of increased density in the medial aspect of the left lower lobe. This measures 2.2 cm in transverse diameter and previously measured approximately 11 mm in transverse diameter. This has previously show no significant metabolic activity although the increase in size is somewhat suspicious. Short-term follow-up in 6 months is recommended. Hepatobiliary: Gallbladder is well distended with evidence of cholelithiasis. Some questionable pericholecystic fluid is noted. No wall thickening is seen. The liver is within normal limits Pancreas: Pancreas is well visualized. No definitive mass lesion is noted. Spleen: Normal in size without focal abnormality. Adrenals/Urinary Tract: Adrenal glands are stable in appearance with mild thickening. The kidneys demonstrate bilateral renal cystic change. No obstructive changes are noted. Bladder is partially distended. Stomach/Bowel: Scattered diverticular change of the colon is noted without evidence of diverticulitis. Moderate-sized hiatal hernia is noted increased when compare with the prior exam. Some foci of free air are noted adjacent to the liver as well as the first portion of the duodenum. Some indistinct tissue planes are noted in this region as well in the possibility of a perforated ulcer could not be totally excluded. Vascular/Lymphatic: Aortic atherosclerosis. No enlarged abdominal or pelvic lymph nodes.  Reproductive: Status post hysterectomy. No adnexal masses. Other: No abdominal wall hernia or abnormality. No abdominopelvic ascites. Musculoskeletal: Bilateral superior and inferior pubic rami fractures are noted likely related to the patient's given clinical history of recent fall. Bilateral sacral fractures are noted as well. Only minimal displacement at the fracture sites is seen. There is a right transverse process fracture at L5 no rib abnormalities are noted. Severe degenerative changes of lumbar spine are noted without definitive compression deformity. IMPRESSION: Minimal free air in the upper abdomen adjacent to the liver some indistinct tissue planes are noted in the region of the proximal duodenum. The possibility of a minimally perforated duodenal ulcer could not be totally excluded. Multiple pelvic fractures as described above consistent with the patient's given clinical history of recent fall. Increase in size of soft tissue nodule within the medial aspect of the left lung base when compared with previous exams dating back to 2011. This previously showed no significant metabolic activity on PET-CT. The increase in size is somewhat suspicious however and follow-up examination in 3-6 months is recommended Bilateral lower lobe infiltrative changes. Small effusions are noted as well. Cholelithiasis with some suggestion of pericholecystic fluid. This may be related to the adjacent duodenal changes. Critical Value/emergent results were called by telephone at the time of interpretation on 12/20/2017 at 6:25 am to Dr. Pryor Curia , who verbally acknowledged these results. Electronically Signed   By: Inez Catalina M.D.   On: 12/20/2017 06:24     Phillips Climes M.D on 12/21/2017 at 1:05 PM  Between 7am to 7pm - Pager - [phone]  After 7pm go to [email or web address] - password [facility]  [facility] -  Office  [phone]

## 2017-12-21 NOTE — Progress Notes (Signed)
Patient woke up disoriented and impulsive  stated, "I want to go home." Patient was taken to the nurses station in a recliner to sit with the staff due to fear of her falling. Patient now back to her baseline and taken to bed. Will continue to monitor.

## 2017-12-22 ENCOUNTER — Inpatient Hospital Stay (HOSPITAL_COMMUNITY): Payer: Medicare Other

## 2017-12-22 DIAGNOSIS — D62 Acute posthemorrhagic anemia: Secondary | ICD-10-CM

## 2017-12-22 LAB — CBC
HCT: 25.5 % — ABNORMAL LOW (ref 36.0–46.0)
HEMOGLOBIN: 8 g/dL — AB (ref 12.0–15.0)
MCH: 30.4 pg (ref 26.0–34.0)
MCHC: 31.4 g/dL (ref 30.0–36.0)
MCV: 97 fL (ref 78.0–100.0)
PLATELETS: 321 10*3/uL (ref 150–400)
RBC: 2.63 MIL/uL — AB (ref 3.87–5.11)
RDW: 15.8 % — AB (ref 11.5–15.5)
WBC: 10.1 10*3/uL (ref 4.0–10.5)

## 2017-12-22 LAB — BASIC METABOLIC PANEL
ANION GAP: 12 (ref 5–15)
BUN: 25 mg/dL — ABNORMAL HIGH (ref 8–23)
CO2: 21 mmol/L — AB (ref 22–32)
Calcium: 7.7 mg/dL — ABNORMAL LOW (ref 8.9–10.3)
Chloride: 105 mmol/L (ref 98–111)
Creatinine, Ser: 1.32 mg/dL — ABNORMAL HIGH (ref 0.44–1.00)
GFR calc non Af Amer: 35 mL/min — ABNORMAL LOW (ref >60)
GFR, EST AFRICAN AMERICAN: 40 mL/min — AB (ref >60)
Glucose, Bld: 37 mg/dL — CL (ref 70–99)
POTASSIUM: 3.4 mmol/L — AB (ref 3.5–5.1)
SODIUM: 138 mmol/L (ref 135–145)

## 2017-12-22 LAB — GLUCOSE, CAPILLARY
Glucose-Capillary: 28 mg/dL — CL (ref 70–99)
Glucose-Capillary: 114 mg/dL — ABNORMAL HIGH (ref 70–99)

## 2017-12-22 LAB — TYPE AND SCREEN
ABO/RH(D): O NEG
ANTIBODY SCREEN: NEGATIVE

## 2017-12-22 LAB — ABO/RH: ABO/RH(D): O NEG

## 2017-12-22 MED ORDER — KCL IN DEXTROSE-NACL 20-5-0.45 MEQ/L-%-% IV SOLN
INTRAVENOUS | Status: DC
  Administered 2017-12-22 – 2017-12-23 (×2): via INTRAVENOUS
  Administered 2017-12-23: 750 mL via INTRAVENOUS
  Filled 2017-12-22 (×2): qty 1000

## 2017-12-22 MED ORDER — ACETAMINOPHEN 325 MG PO TABS
650.0000 mg | ORAL_TABLET | Freq: Four times a day (QID) | ORAL | Status: DC | PRN
  Administered 2017-12-22: 650 mg via ORAL
  Filled 2017-12-22: qty 2

## 2017-12-22 MED ORDER — DEXTROSE 50 % IV SOLN: 1.0000 | Freq: Once | INTRAVENOUS | Status: DC

## 2017-12-22 MED ORDER — DEXTROSE 50 % IV SOLN
INTRAVENOUS | Status: AC
  Administered 2017-12-22: 07:00:00
  Filled 2017-12-22: qty 50

## 2017-12-22 NOTE — Progress Notes (Signed)
Lab called that patient's blood glucose was 37. Manual CBG was 28 mg/dl. Dextrose 1 amp IV push administered and will be recheck in 15 minutes by the incoming RN. Patient was asymptomatic and talkative. Will continue to monitor.

## 2017-12-22 NOTE — Progress Notes (Signed)
Patient ID: [name], female   DOB: 1926/06/14, 84 y.o.   MRN: [id number]       Subjective: Pt oriented totally today, but messed up her hearing aid and can't hear me.  She is in NAD and denies any pain currently.  Objective: Vital signs in last 24 hours: Temp:  [97.6 F (36.4 C)-97.8 F (36.6 C)] 97.8 F (36.6 C) (09/28 2311) Pulse Rate:  [49-60] 56 (09/29 0625) Resp:  [16-19] 19 (09/28 2311) BP: (138-150)/(53-65) 150/65 (09/29 0625) SpO2:  [94 %-97 %] 94 % (09/29 0625) Last BM Date: 12/18/17  Intake/Output from previous day: 09/28 0701 - 09/29 0700 In: 350 [I.V.:300; IV Piggyback:50] Out: -  Intake/Output this shift: No intake/output data recorded.  PE: Heart: regular, + murmur Lungs: CTAB Abd: soft, mild epigastric tenderness, no guarding, or peritonitis, +BS, ND  Lab Results:  Recent Labs    12/21/17 1526 12/21/17 2117 12/22/17 0600  WBC 9.0  --  10.1  HGB 7.6* 8.2* 8.0*  HCT 23.9* 26.0* 25.5*  PLT 285  --  321   BMET Recent Labs    12/21/17 0331 12/22/17 0600  NA 134* 138  K 3.3* 3.4*  CL 102 105  CO2 23 21*  GLUCOSE 60* 37*  BUN 22 25*  CREATININE 1.20* 1.32*  CALCIUM 7.7* 7.7*   PT/INR No results for input(s): LABPROT, INR in the last 72 hours. CMP     Component Value Date/Time   NA 138 12/22/2017 0600   K 3.4 (L) 12/22/2017 0600   CL 105 12/22/2017 0600   CO2 21 (L) 12/22/2017 0600   GLUCOSE 37 (LL) 12/22/2017 0600   BUN 25 (H) 12/22/2017 0600   CREATININE 1.32 (H) 12/22/2017 0600   CALCIUM 7.7 (L) 12/22/2017 0600   PROT 5.5 (L) 12/19/2017 2300   ALBUMIN 2.9 (L) 12/19/2017 2300   AST 15 12/19/2017 2300   ALT 8 12/19/2017 2300   ALKPHOS 118 12/19/2017 2300   BILITOT 0.5 12/19/2017 2300   GFRNONAA 35 (L) 12/22/2017 0600   GFRAA 40 (L) 12/22/2017 0600   Lipase     Component Value Date/Time   LIPASE 25 12/19/2017 2300       Studies/Results: No results found.  Anti-infectives: Anti-infectives (From admission,  onward)   Start     Dose/Rate Route Frequency Ordered Stop   12/20/17 1300  piperacillin-tazobactam (ZOSYN) IVPB 3.375 g     3.375 g 12.5 mL/hr over 240 Minutes Intravenous Every 8 hours 12/20/17 1011     12/20/17 0645  piperacillin-tazobactam (ZOSYN) IVPB 3.375 g     3.375 g 100 mL/hr over 30 Minutes Intravenous  Once 12/20/17 0644 12/20/17 0757       Assessment/Plan Likely contained perforation of duodenal ulcer -cont NPO -plan for swallow study today -cont abx therapy -mobilize as able L5 transverse process fracture, bilateral sacral fracture, pubic rami fractures -follow up with [name] as per ortho reqs. -mobilize as able Multiple medical problems  FEN - NPO/ UGI today VTE - SCDs/ heparin ID - zosyn 9/27 -->   LOS: 2 days    Henreitta Cea , [facility] 12/22/2017, 8:40 AM Pager: [phone]

## 2017-12-22 NOTE — Progress Notes (Signed)
PROGRESS NOTE                                                                                                                                                                                                             Patient Demographics:    [name], is a 84 y.o. female, DOB - [birth year], EFE:[id number]  Admit date - 12/19/2017   Admitting Physician Sid Falcon, MD  Outpatient Primary MD for the patient is [name], MD  LOS - 2   Chief Complaint  Patient presents with  . Abdominal Pain       Brief Narrative    84 y.o. female with medical history significant of hypothyroidism, spinal stenosis/kyphosis, HTN, HLD, h/o breast cancer, arthritis who presented with abdominal pain, imaging was significant for serrated peptic ulcer.   Subjective:    [name] today has, No headache, No chest pain, he denies abdominal pain, nausea or vomiting, no significant events overnight .   Assessment  & Plan :    Active Problems:   Duodenal ulcer perforation (HCC)   Intra-abdominal free air of unknown etiology   Duodenal ulcer perforation -Management primarily by general surgery -Further recommendation is for conservative management -Continue with IV fluids, she is hypoglycemic this morning,/has been transitioned to D5 half-normal saline, continue with IV antibiotics, continue with IV Protonix as well . -None for UGI today, no evidence of leak 0 surgery may start on clear liquids .  Acute blood loss anemia  -Globin has dropped during hospital stay, some delusional factor, as well possibly related to upper GI bleed from her perforated ulcer, I have discussed with general surgery, and this point, EGD is not recommended in the setting of perforated duodenal ulcer, continue with supportive measures and transfuse as needed, continue with PPI drip, and she will need endoscopy in outpatient setting when more stable after 78-month to rule out  bleed or tumor .  Pelvic Fracture - Orthopedics consulted, she is WBAT bilaterally -PT consulted  Hypothyroidism - Change to IV synthroid, half of home dose, 73mcg IV daily  Spinal stenosis/arthritis - Pain control with low dose morphine prn - Needs bowel regimen PR  HTN -Overall blood pressure acceptable, continue to hold oral meds, continue with PRN meds.    Code Status : Full  Family Communication  : None at bedside  Disposition Plan  : pending further work up.  Consults  :  General surgery  Procedures  : None  DVT Prophylaxis  :  SCD, hold chemical anticoagulation in the setting of perforated peptic ulcer  Lab Results  Component Value Date   PLT 321 12/22/2017    Antibiotics  :   Anti-infectives (From admission, onward)   Start     Dose/Rate Route Frequency Ordered Stop   12/20/17 1300  piperacillin-tazobactam (ZOSYN) IVPB 3.375 g     3.375 g 12.5 mL/hr over 240 Minutes Intravenous Every 8 hours 12/20/17 1011     12/20/17 0645  piperacillin-tazobactam (ZOSYN) IVPB 3.375 g     3.375 g 100 mL/hr over 30 Minutes Intravenous  Once 12/20/17 0644 12/20/17 0757        Objective:   Vitals:   12/21/17 0457 12/21/17 1539 12/21/17 2311 12/22/17 0625  BP: 118/68 138/62 (!) 149/53 (!) 150/65  Pulse: 72 60 (!) 49 (!) 56  Resp: 16 16 19    Temp: 98.6 F (37 C) 97.6 F (36.4 C) 97.8 F (36.6 C)   TempSrc:  Oral Oral   SpO2: 98% 96% 97% 94%  Weight:      Height:        Wt Readings from Last 3 Encounters:  12/20/17 59 kg  10/09/16 59 kg  11/01/14 60.8 kg     Intake/Output Summary (Last 24 hours) at 12/22/2017 1206 Last data filed at 12/22/2017 0160 Gross per 24 hour  Intake 811.42 ml  Output -  Net 811.42 ml     Physical Exam  Awake, extremely hard of hearing(she tells me her hearing aid battery just quit working), Symmetrical Chest wall movement, Good air movement bilaterally, CTAB RRR,No Gallops,Rubs, No Parasternal Heave +ve B.Sounds, Abd  Soft, mild epigastric tenderness, No rebound - guarding or rigidity. No Cyanosis, Clubbing or edema, No new Rash or bruise       Data Review:    CBC Recent Labs  Lab 12/19/17 2300 12/21/17 0331 12/21/17 1526 12/21/17 2117 12/22/17 0600  WBC 11.5* 11.3* 9.0  --  10.1  HGB 9.0* 7.8* 7.6* 8.2* 8.0*  HCT 28.7* 24.7* 23.9* 26.0* 25.5*  PLT 327 300 285  --  321  MCV 96.3 95.7 96.0  --  97.0  MCH 30.2 30.2 30.5  --  30.4  MCHC 31.4 31.6 31.8  --  31.4  RDW 15.9* 15.9* 15.9*  --  15.8*    Chemistries  Recent Labs  Lab 12/19/17 2300 12/21/17 0331 12/22/17 0600  NA 131* 134* 138  K 3.8 3.3* 3.4*  CL 98 102 105  CO2 25 23 21*  GLUCOSE 132* 60* 37*  BUN 18 22 25*  CREATININE 1.25* 1.20* 1.32*  CALCIUM 8.0* 7.7* 7.7*  AST 15  --   --   ALT 8  --   --   ALKPHOS 118  --   --   BILITOT 0.5  --   --    ------------------------------------------------------------------------------------------------------------------ No results for input(s): CHOL, HDL, LDLCALC, TRIG, CHOLHDL, LDLDIRECT in the last 72 hours.  No results found for: HGBA1C ------------------------------------------------------------------------------------------------------------------ No results for input(s): TSH, T4TOTAL, T3FREE, THYROIDAB in the last 72 hours.  Invalid input(s): FREET3 ------------------------------------------------------------------------------------------------------------------ No results for input(s): VITAMINB12, FOLATE, FERRITIN, TIBC, IRON, RETICCTPCT in the last 72 hours.  Coagulation profile No results for input(s): INR, PROTIME in the last 168 hours.  No results for input(s): DDIMER in the last 72 hours.  Cardiac Enzymes No results for input(s):  CKMB, TROPONINI, MYOGLOBIN in the last 168 hours.  Invalid input(s): CK ------------------------------------------------------------------------------------------------------------------ No results found for: BNP  Inpatient  Medications  Scheduled Meds: . dextrose  1 ampule Intravenous Once  . levothyroxine  75 mcg Intravenous Daily  . [START ON 12/24/2017] pantoprazole  40 mg Intravenous Q12H   Continuous Infusions: . dextrose 5 % and 0.45 % NaCl with KCl 20 mEq/L 75 mL/hr at 12/22/17 0934  . pantoprozole (PROTONIX) infusion 8 mg/hr (12/22/17 0929)  . piperacillin-tazobactam (ZOSYN)  IV 3.375 g (12/22/17 0521)   PRN Meds:.bisacodyl, hydrALAZINE, morphine injection, MUSCLE RUB  Micro Results No results found for this or any previous visit (from the past 240 hour(s)).  Radiology Reports Ct Abdomen Pelvis W Contrast  Result Date: 12/20/2017 CLINICAL DATA:  Lower abdominal pain for 2 days with symptoms of constipation EXAM: CT ABDOMEN AND PELVIS WITH CONTRAST TECHNIQUE: Multidetector CT imaging of the abdomen and pelvis was performed using the standard protocol following bolus administration of intravenous contrast. CONTRAST:  49mL OMNIPAQUE IOHEXOL 300 MG/ML  SOLN COMPARISON:  02/04/2010 FINDINGS: Lower chest: Lung bases demonstrate bilateral infiltrative opacities. Small effusions are noted bilaterally. Additionally there is a somewhat more rounded area of increased density in the medial aspect of the left lower lobe. This measures 2.2 cm in transverse diameter and previously measured approximately 11 mm in transverse diameter. This has previously show no significant metabolic activity although the increase in size is somewhat suspicious. Short-term follow-up in 6 months is recommended. Hepatobiliary: Gallbladder is well distended with evidence of cholelithiasis. Some questionable pericholecystic fluid is noted. No wall thickening is seen. The liver is within normal limits Pancreas: Pancreas is well visualized. No definitive mass lesion is noted. Spleen: Normal in size without focal abnormality. Adrenals/Urinary Tract: Adrenal glands are stable in appearance with mild thickening. The kidneys demonstrate bilateral renal  cystic change. No obstructive changes are noted. Bladder is partially distended. Stomach/Bowel: Scattered diverticular change of the colon is noted without evidence of diverticulitis. Moderate-sized hiatal hernia is noted increased when compare with the prior exam. Some foci of free air are noted adjacent to the liver as well as the first portion of the duodenum. Some indistinct tissue planes are noted in this region as well in the possibility of a perforated ulcer could not be totally excluded. Vascular/Lymphatic: Aortic atherosclerosis. No enlarged abdominal or pelvic lymph nodes. Reproductive: Status post hysterectomy. No adnexal masses. Other: No abdominal wall hernia or abnormality. No abdominopelvic ascites. Musculoskeletal: Bilateral superior and inferior pubic rami fractures are noted likely related to the patient's given clinical history of recent fall. Bilateral sacral fractures are noted as well. Only minimal displacement at the fracture sites is seen. There is a right transverse process fracture at L5 no rib abnormalities are noted. Severe degenerative changes of lumbar spine are noted without definitive compression deformity. IMPRESSION: Minimal free air in the upper abdomen adjacent to the liver some indistinct tissue planes are noted in the region of the proximal duodenum. The possibility of a minimally perforated duodenal ulcer could not be totally excluded. Multiple pelvic fractures as described above consistent with the patient's given clinical history of recent fall. Increase in size of soft tissue nodule within the medial aspect of the left lung base when compared with previous exams dating back to 2011. This previously showed no significant metabolic activity on PET-CT. The increase in size is somewhat suspicious however and follow-up examination in 3-6 months is recommended Bilateral lower lobe infiltrative changes. Small effusions are noted as well.  Cholelithiasis with some suggestion of  pericholecystic fluid. This may be related to the adjacent duodenal changes. Critical Value/emergent results were called by telephone at the time of interpretation on 12/20/2017 at 6:25 am to Dr. Pryor Curia , who verbally acknowledged these results. Electronically Signed   By: Inez Catalina M.D.   On: 12/20/2017 06:24     Phillips Climes M.D on 12/22/2017 at 12:06 PM  Between 7am to 7pm - Pager - [phone]  After 7pm go to [email or web address] - password [facility]  [facility] -  Office  [phone]

## 2017-12-23 ENCOUNTER — Inpatient Hospital Stay (HOSPITAL_COMMUNITY): Payer: Medicare Other

## 2017-12-23 LAB — BASIC METABOLIC PANEL
ANION GAP: 8 (ref 5–15)
BUN: 19 mg/dL (ref 8–23)
CO2: 23 mmol/L (ref 22–32)
Calcium: 7.9 mg/dL — ABNORMAL LOW (ref 8.9–10.3)
Chloride: 107 mmol/L (ref 98–111)
Creatinine, Ser: 1.19 mg/dL — ABNORMAL HIGH (ref 0.44–1.00)
GFR calc Af Amer: 46 mL/min — ABNORMAL LOW (ref >60)
GFR, EST NON AFRICAN AMERICAN: 39 mL/min — AB (ref >60)
Glucose, Bld: 74 mg/dL (ref 70–99)
POTASSIUM: 3.4 mmol/L — AB (ref 3.5–5.1)
Sodium: 138 mmol/L (ref 135–145)

## 2017-12-23 LAB — CBC
HEMATOCRIT: 26.4 % — AB (ref 36.0–46.0)
Hemoglobin: 8.4 g/dL — ABNORMAL LOW (ref 12.0–15.0)
MCH: 30.5 pg (ref 26.0–34.0)
MCHC: 31.8 g/dL (ref 30.0–36.0)
MCV: 96 fL (ref 78.0–100.0)
PLATELETS: 318 10*3/uL (ref 150–400)
RBC: 2.75 MIL/uL — AB (ref 3.87–5.11)
RDW: 15.6 % — AB (ref 11.5–15.5)
WBC: 7.9 10*3/uL (ref 4.0–10.5)

## 2017-12-23 LAB — IRON AND TIBC
IRON: 52 ug/dL (ref 28–170)
SATURATION RATIOS: 29 % (ref 10.4–31.8)
TIBC: 176 ug/dL — AB (ref 250–450)
UIBC: 124 ug/dL

## 2017-12-23 LAB — RETICULOCYTES
RBC.: 2.75 MIL/uL — ABNORMAL LOW (ref 3.87–5.11)
Retic Count, Absolute: 35.8 10*3/uL (ref 19.0–186.0)
Retic Ct Pct: 1.3 % (ref 0.4–3.1)

## 2017-12-23 LAB — FOLATE: FOLATE: 7.1 ng/mL (ref >5.9)

## 2017-12-23 LAB — FERRITIN: Ferritin: 325 ng/mL — ABNORMAL HIGH (ref 11–307)

## 2017-12-23 LAB — VITAMIN B12: VITAMIN B 12: 828 pg/mL (ref 180–914)

## 2017-12-23 MED ORDER — ATENOLOL 50 MG PO TABS
100.0000 mg | ORAL_TABLET | Freq: Every morning | ORAL | Status: DC
  Administered 2017-12-23 – 2017-12-25 (×3): 100 mg via ORAL
  Filled 2017-12-23 (×3): qty 2

## 2017-12-23 MED ORDER — DOXAZOSIN MESYLATE 4 MG PO TABS
4.0000 mg | ORAL_TABLET | Freq: Every day | ORAL | Status: DC
  Administered 2017-12-23 – 2017-12-24 (×2): 4 mg via ORAL
  Filled 2017-12-23 (×2): qty 1

## 2017-12-23 MED ORDER — FUROSEMIDE 40 MG PO TABS
40.0000 mg | ORAL_TABLET | Freq: Every day | ORAL | Status: DC
  Administered 2017-12-23 – 2017-12-25 (×3): 40 mg via ORAL
  Filled 2017-12-23 (×3): qty 1

## 2017-12-23 MED ORDER — IOHEXOL 300 MG/ML  SOLN
150.0000 mL | Freq: Once | INTRAMUSCULAR | Status: AC | PRN
  Administered 2017-12-23: 75 mL via ORAL

## 2017-12-23 MED ORDER — ENSURE ENLIVE PO LIQD
237.0000 mL | Freq: Two times a day (BID) | ORAL | Status: DC
  Administered 2017-12-23 – 2017-12-25 (×3): 237 mL via ORAL

## 2017-12-23 NOTE — Progress Notes (Signed)
Patient ID: [name], female   DOB: 26-Oct-1926, 84 y.o.   MRN: [id number]       Subjective: Patient looks good today sitting up in a chair.  No complaints.  Ready to eat.  Had a BM this morning.    Objective: Vital signs in last 24 hours: Temp:  [97.7 F (36.5 C)-98.4 F (36.9 C)] 98.4 F (36.9 C) (09/30 0800) Pulse Rate:  [57-64] 60 (09/30 0800) Resp:  [18-19] 18 (09/30 0800) BP: (136-191)/(76-86) 174/80 (09/30 0800) SpO2:  [96 %-98 %] 98 % (09/30 0800) Last BM Date: 12/23/17  Intake/Output from previous day: 09/29 0701 - 09/30 0700 In: 2333.9 [I.V.:2088.5; IV Piggyback:245.4] Out: -  Intake/Output this shift: No intake/output data recorded.  PE: Heart: regular, + murmur Lungs: CTAB Abd: soft, NT, ND, +BS  Lab Results:  Recent Labs    12/22/17 0600 12/23/17 0527  WBC 10.1 7.9  HGB 8.0* 8.4*  HCT 25.5* 26.4*  PLT 321 318   BMET Recent Labs    12/22/17 0600 12/23/17 0527  NA 138 138  K 3.4* 3.4*  CL 105 107  CO2 21* 23  GLUCOSE 37* 74  BUN 25* 19  CREATININE 1.32* 1.19*  CALCIUM 7.7* 7.9*   PT/INR No results for input(s): LABPROT, INR in the last 72 hours. CMP     Component Value Date/Time   NA 138 12/23/2017 0527   K 3.4 (L) 12/23/2017 0527   CL 107 12/23/2017 0527   CO2 23 12/23/2017 0527   GLUCOSE 74 12/23/2017 0527   BUN 19 12/23/2017 0527   CREATININE 1.19 (H) 12/23/2017 0527   CALCIUM 7.9 (L) 12/23/2017 0527   PROT 5.5 (L) 12/19/2017 2300   ALBUMIN 2.9 (L) 12/19/2017 2300   AST 15 12/19/2017 2300   ALT 8 12/19/2017 2300   ALKPHOS 118 12/19/2017 2300   BILITOT 0.5 12/19/2017 2300   GFRNONAA 39 (L) 12/23/2017 0527   GFRAA 46 (L) 12/23/2017 0527   Lipase     Component Value Date/Time   LIPASE 25 12/19/2017 2300       Studies/Results: No results found.  Anti-infectives: Anti-infectives (From admission, onward)   Start     Dose/Rate Route Frequency Ordered Stop   12/20/17 1300  piperacillin-tazobactam (ZOSYN) IVPB  3.375 g     3.375 g 12.5 mL/hr over 240 Minutes Intravenous Every 8 hours 12/20/17 1011     12/20/17 0645  piperacillin-tazobactam (ZOSYN) IVPB 3.375 g     3.375 g 100 mL/hr over 30 Minutes Intravenous  Once 12/20/17 0644 12/20/17 0757       Assessment/Plan Likely contained perforation of duodenal ulcer -cont NPO -await swallow study today, hopefully if this looks good then she can have clear liquids -cont abx therapy -mobilize as able L5 transverse process fracture, bilateral sacral fracture, pubic rami fractures -follow up with [name] as per ortho reqs. -mobilize as able Multiple medical problems  FEN -NPO/ UGI today VTE -SCDs/ heparin ID -zosyn 9/27 -->   LOS: 3 days    Henreitta Cea , [facility] 12/23/2017, 8:45 AM Pager: [phone]

## 2017-12-23 NOTE — Evaluation (Signed)
Physical Therapy Evaluation Patient Details Name: [name] MRN: [id number] DOB: 1926/07/15 Today's Date: 12/23/2017   History of Present Illness  84 y.o. female with medical history significant of hypothyroidism, spinal stenosis/kyphosis, HTN, HLD, h/o breast cancer, arthritis who presented with abdominal pain.  Dx of duodenal ulcer perforation, anemia. Recent hx of fall in early August with pelvic fracture.   Clinical Impression  Pt admitted with above diagnosis. Pt currently with functional limitations due to the deficits listed below (see PT Problem List). Mod assist sit to stand, pt ambulated 3' x 2 with rollator, distance limited by fatigue. Pt will need 24* assist upon acute stay due to high fall risk. Pt lives with son who works, daughter lives in [place], will likely need ST-SNF.  Pt will benefit from skilled PT to increase their independence and safety with mobility to allow discharge to the venue listed below.       Follow Up Recommendations SNF;Supervision/Assistance - 24 hour;Supervision for mobility/OOB    Equipment Recommendations  None recommended by PT    Recommendations for Other Services       Precautions / Restrictions Precautions Precautions: Fall Precaution Comments: pt reports 2 falls in past 1 year, per chart pt fell in August and sustained pelvic fx Restrictions Weight Bearing Restrictions: No      Mobility  Bed Mobility               General bed mobility comments: up in recliner  Transfers Overall transfer level: Needs assistance Equipment used: 4-wheeled walker Transfers: Sit to/from Stand Sit to Stand: Mod assist         General transfer comment: assist to rise, VCs hand placement  Ambulation/Gait Ambulation/Gait assistance: Min assist;Mod assist Gait Distance (Feet): 6 Feet(3' x 2) Assistive device: 4-wheeled walker Gait Pattern/deviations: Step-to pattern;Trunk flexed;Decreased stride length Gait velocity: decr   General  Gait Details: 3' x 2 with seated rest break, distance limited by fatigue, min/mod A for balance, pronounced kyphosis  Stairs            Wheelchair Mobility    Modified Rankin (Stroke Patients Only)       Balance Overall balance assessment: History of Falls;Needs assistance   Sitting balance-Leahy Scale: Fair     Standing balance support: Bilateral upper extremity supported Standing balance-Leahy Scale: Poor                               Pertinent Vitals/Pain Pain Assessment: No/denies pain    Home Living Family/patient expects to be discharged to:: Private residence Living Arrangements: Children;Other relatives(son and grandson) Available Help at Discharge: Family;Available PRN/intermittently(son/grandson work; daughter lives in [place] ) Type of Home: House Home Access: Stairs to enter Entrance Stairs-Rails: Horticulturist, commercial of Steps: 4 Home Layout: One level Home Equipment: Environmental consultant - 4 wheels      Prior Function Level of Independence: Independent with assistive device(s)         Comments: walks with rollator, independent sponge bathing     Hand Dominance        Extremity/Trunk Assessment   Upper Extremity Assessment Upper Extremity Assessment: Overall WFL for tasks assessed    Lower Extremity Assessment Lower Extremity Assessment: Overall WFL for tasks assessed(pronounced pitting edema BLEs, pt reports HHRN wrapped her legs at home PTA; B knee ext -4/5)    Cervical / Trunk Assessment Cervical / Trunk Assessment: Kyphotic  Communication   Communication: HOH  Cognition Arousal/Alertness:  Awake/alert Behavior During Therapy: WFL for tasks assessed/performed Overall Cognitive Status: Within Functional Limits for tasks assessed                                        General Comments      Exercises     Assessment/Plan    PT Assessment Patient needs continued PT services  PT Problem List Decreased  balance;Decreased strength;Decreased mobility;Decreased activity tolerance       PT Treatment Interventions Functional mobility training;Therapeutic activities;Balance training;Patient/family education    PT Goals (Current goals can be found in the Care Plan section)  Acute Rehab PT Goals Patient Stated Goal: return home PT Goal Formulation: With patient/family Time For Goal Achievement: 01/06/18 Potential to Achieve Goals: Fair    Frequency Min 2X/week   Barriers to discharge        Co-evaluation               AM-PAC PT "6 Clicks" Daily Activity  Outcome Measure Difficulty turning over in bed (including adjusting bedclothes, sheets and blankets)?: Unable Difficulty moving from lying on back to sitting on the side of the bed? : Unable Difficulty sitting down on and standing up from a chair with arms (e.g., wheelchair, bedside commode, etc,.)?: Unable Help needed moving to and from a bed to chair (including a wheelchair)?: A Lot Help needed walking in hospital room?: A Lot Help needed climbing 3-5 steps with a railing? : Total 6 Click Score: 8    End of Session Equipment Utilized During Treatment: Gait belt Activity Tolerance: Patient limited by fatigue Patient left: in chair;with call bell/phone within reach;with chair alarm set;with nursing/sitter in room;with family/visitor present Nurse Communication: Mobility status PT Visit Diagnosis: Difficulty in walking, not elsewhere classified (R26.2);History of falling (Z91.81)    Time: 1610-9604 PT Time Calculation (min) (ACUTE ONLY): 28 min   Charges:   PT Evaluation $PT Eval Moderate Complexity: 1 Mod PT Treatments $Therapeutic Activity: 8-22 mins       Blondell Reveal Kistler PT 12/23/2017  Acute Rehabilitation Services Pager [phone] Office [phone]

## 2017-12-23 NOTE — Care Management Important Message (Signed)
Important Message  Patient Details  Name: [name] MRN: [id number] Date of Birth: Jul 30, 1926   Medicare Important Message Given:  Yes    Orbie Pyo 12/23/2017, 4:19 PM

## 2017-12-23 NOTE — NC FL2 (Signed)
[place] LEVEL OF CARE SCREENING TOOL     IDENTIFICATION  Patient Name: [name] Birthdate: October 29, 1926 Sex: female Admission Date (Current Location): 12/19/2017  [facility] and [place] Number:  Herbalist and Address:  [facility] [place] [place] [address] [place], [place] [address]      Provider Number: [id number]  Attending Physician Name and Address:  Albertine Patricia, MD  Relative Name and Phone Number:  Yvone Neu, son, [phone]    Current Level of Care: Hospital Recommended Level of Care: [place] Prior Approval Number:    Date Approved/Denied:   PASRR Number: [id number]  Discharge Plan: SNF    Current Diagnoses: Patient Active Problem List   Diagnosis Date Noted  . Duodenal ulcer perforation ([place]) 12/20/2017  . Intra-abdominal free air of unknown etiology   . Breast cancer, left breast ([place]) 04/03/2011    Orientation RESPIRATION BLADDER Height & Weight     Self, Time, Situation, Place  Normal Continent Weight: 59 kg Height:  5' (152.4 cm)  BEHAVIORAL SYMPTOMS/MOOD NEUROLOGICAL BOWEL NUTRITION STATUS      Continent Diet(Please see DC Summary)  AMBULATORY STATUS COMMUNICATION OF NEEDS Skin   Extensive Assist Verbally Surgical wounds(Closed incision on hand)                       Personal Care Assistance Level of Assistance  Bathing, Feeding, Dressing Bathing Assistance: Maximum assistance Feeding assistance: Limited assistance Dressing Assistance: Limited assistance     Functional Limitations Info  Hearing   Hearing Info: Impaired(Hearing aid)      SPECIAL CARE FACTORS FREQUENCY  PT (By licensed PT), OT (By licensed OT)     PT Frequency: 5x/week OT Frequency: 3x/week            Contractures Contractures Info: Not present    Additional Factors Info  Code Status, Allergies Code Status Info: Full Allergies Info: Aspirin           Current Medications (12/23/2017):   This is the current hospital active medication list Current Facility-Administered Medications  Medication Dose Route Frequency Provider Last Rate Last Dose  . acetaminophen (TYLENOL) tablet 650 mg  650 mg Oral Q6H PRN Elgergawy, Silver Huguenin, MD   650 mg at 12/22/17 1521  . bisacodyl (DULCOLAX) suppository 10 mg  10 mg Rectal Daily PRN Gilles Chiquito B, MD      . dextrose 5 % and 0.45 % NaCl with KCl 20 mEq/L infusion   Intravenous Continuous Elgergawy, Silver Huguenin, MD 75 mL/hr at 12/22/17 0934    . dextrose 50 % solution 50 mL  1 ampule Intravenous Once Elgergawy, Dawood S, MD      . hydrALAZINE (APRESOLINE) injection 2 mg  2 mg Intravenous Q8H PRN Sid Falcon, MD      . levothyroxine (SYNTHROID, LEVOTHROID) injection 75 mcg  75 mcg Intravenous Daily Sid Falcon, MD   75 mcg at 12/23/17 1109  . morphine 2 MG/ML injection 0.5 mg  0.5 mg Intravenous Q4H PRN Gilles Chiquito B, MD   0.5 mg at 12/21/17 2337  . MUSCLE RUB CREA 1 application  1 application Topical PRN Gilles Chiquito B, MD      . pantoprazole (PROTONIX) 80 mg in sodium chloride 0.9 % 250 mL (0.32 mg/mL) infusion  8 mg/hr Intravenous Continuous Elgergawy, Silver Huguenin, MD 25 mL/hr at 12/23/17 0734 8 mg/hr at 12/23/17 0734  . [START ON 12/24/2017] pantoprazole (PROTONIX) injection  40 mg  40 mg Intravenous Q12H Elgergawy, Silver Huguenin, MD      . piperacillin-tazobactam (ZOSYN) IVPB 3.375 g  3.375 g Intravenous Q8H Rumbarger, Valeda Malm, RPH 12.5 mL/hr at 12/23/17 0626 3.375 g at 12/23/17 9471     Discharge Medications: Please see discharge summary for a list of discharge medications.  Relevant Imaging Results:  Relevant Lab Results:   Additional Information SSN: [place], [place]

## 2017-12-23 NOTE — Progress Notes (Signed)
PROGRESS NOTE                                                                                                                                                                                                             Patient Demographics:    [name], is a 84 y.o. female, DOB - [birth year], XBW:[id number]  Admit date - 12/19/2017   Admitting Physician Sid Falcon, MD  Outpatient Primary MD for the patient is [name], MD  LOS - 3   Chief Complaint  Patient presents with  . Abdominal Pain       Brief Narrative    84 y.o. female with medical history significant of hypothyroidism, spinal stenosis/kyphosis, HTN, HLD, h/o breast cancer, arthritis who presented with abdominal pain, imaging was significant for serrated peptic ulcer, she was seen by general surgery, recommendation has been made for operative management, she was kept n.p.o., on IV fluids and IV Zosyn and Protonix, patient continues to improve, UGI study 11/2017 with no evidence of leak, so she was started on clear liquid diet.   Subjective:    [name] today has, No headache, No chest pain, he denies abdominal pain, nausea or vomiting, no significant events overnight .   Assessment  & Plan :    Active Problems:   Duodenal ulcer perforation (HCC)   Intra-abdominal free air of unknown etiology   Duodenal ulcer perforation -Management primarily by general surgery, mentation is for nonoperative management -GI this morning with no evidence of leak, she was started on clear liquid diet -Continue with IV Protonix, continue with IV  Zosyn as well, -Continue with D5 half-normal saline in the setting of hypoglycemia, now she is taking oral hopefully can be stopped soon if no recurrence, will start Ensure as well  Acute blood loss anemia  -Globin has dropped during hospital stay, some delusional factor, as well possibly related to upper GI bleed from her perforated  ulcer, I have discussed with general surgery, and this point, EGD is not recommended in the setting of perforated duodenal ulcer, continue with supportive measures and transfuse as needed, continue with PPI drip, and she will need endoscopy in outpatient setting when more stable after 38-month to rule out bleed or tumor .  Pelvic Fracture - Orthopedics consulted, she is WBAT bilaterally -Seen by PT, recommendation for SNF  Hypothyroidism - Change to IV synthroid, half of home dose, 46mcg IV daily  Spinal stenosis/arthritis - Pain control with low dose morphine prn - Needs bowel regimen PR  HTN -Pressure started to increase, now she can take oral will resume atenolol and Cardura, continue to hold lisinopril  Patient has bilateral Unna boots, seen by wound care, no open ulcers or wound, which has been discontinued  Code Status : Full  Family Communication  : None at bedside  Disposition Plan  : Will need SNF placement  Consults  :  General surgery  Procedures  : None  DVT Prophylaxis  :  SCD, hold chemical anticoagulation in the setting of perforated peptic ulcer  Lab Results  Component Value Date   PLT 318 12/23/2017    Antibiotics  :   Anti-infectives (From admission, onward)   Start     Dose/Rate Route Frequency Ordered Stop   12/20/17 1300  piperacillin-tazobactam (ZOSYN) IVPB 3.375 g     3.375 g 12.5 mL/hr over 240 Minutes Intravenous Every 8 hours 12/20/17 1011     12/20/17 0645  piperacillin-tazobactam (ZOSYN) IVPB 3.375 g     3.375 g 100 mL/hr over 30 Minutes Intravenous  Once 12/20/17 0644 12/20/17 0757        Objective:   Vitals:   12/22/17 2119 12/23/17 0616 12/23/17 0635 12/23/17 0800  BP: (!) 175/86 (!) 191/80 (!) 178/76 (!) 174/80  Pulse: 64 (!) 57  60  Resp: 19   18  Temp: 98.2 F (36.8 C) 98.2 F (36.8 C)  98.4 F (36.9 C)  TempSrc:    Oral  SpO2: 98% 98%  98%  Weight:      Height:        Wt Readings from Last 3 Encounters:  12/20/17  59 kg  10/09/16 59 kg  11/01/14 60.8 kg     Intake/Output Summary (Last 24 hours) at 12/23/2017 1356 Last data filed at 12/23/2017 4098 Gross per 24 hour  Intake 1872.48 ml  Output -  Net 1872.48 ml     Physical Exam  Awake, alert, pleasant, extremely hard of hearing, sitting in recliner in no apparent distress  symmetrical Chest wall movement, Good air movement bilaterally, CTAB RRR,No Gallops,Rubs or new Murmurs, No Parasternal Heave +ve B.Sounds, Abd Soft, No tenderness, No rebound - guarding or rigidity. Bilateral Unna boots      Data Review:    CBC Recent Labs  Lab 12/19/17 2300 12/21/17 0331 12/21/17 1526 12/21/17 2117 12/22/17 0600 12/23/17 0527  WBC 11.5* 11.3* 9.0  --  10.1 7.9  HGB 9.0* 7.8* 7.6* 8.2* 8.0* 8.4*  HCT 28.7* 24.7* 23.9* 26.0* 25.5* 26.4*  PLT 327 300 285  --  321 318  MCV 96.3 95.7 96.0  --  97.0 96.0  MCH 30.2 30.2 30.5  --  30.4 30.5  MCHC 31.4 31.6 31.8  --  31.4 31.8  RDW 15.9* 15.9* 15.9*  --  15.8* 15.6*    Chemistries  Recent Labs  Lab 12/19/17 2300 12/21/17 0331 12/22/17 0600 12/23/17 0527  NA 131* 134* 138 138  K 3.8 3.3* 3.4* 3.4*  CL 98 102 105 107  CO2 25 23 21* 23  GLUCOSE 132* 60* 37* 74  BUN 18 22 25* 19  CREATININE 1.25* 1.20* 1.32* 1.19*  CALCIUM 8.0* 7.7* 7.7* 7.9*  AST 15  --   --   --   ALT 8  --   --   --   ALKPHOS 118  --   --   --  BILITOT 0.5  --   --   --    ------------------------------------------------------------------------------------------------------------------ No results for input(s): CHOL, HDL, LDLCALC, TRIG, CHOLHDL, LDLDIRECT in the last 72 hours.  No results found for: HGBA1C ------------------------------------------------------------------------------------------------------------------ No results for input(s): TSH, T4TOTAL, T3FREE, THYROIDAB in the last 72 hours.  Invalid input(s):  FREET3 ------------------------------------------------------------------------------------------------------------------ Recent Labs    12/23/17 0527  VITAMINB12 828  FOLATE 7.1  FERRITIN 325*  TIBC 176*  IRON 52  RETICCTPCT 1.3    Coagulation profile No results for input(s): INR, PROTIME in the last 168 hours.  No results for input(s): DDIMER in the last 72 hours.  Cardiac Enzymes No results for input(s): CKMB, TROPONINI, MYOGLOBIN in the last 168 hours.  Invalid input(s): CK ------------------------------------------------------------------------------------------------------------------ No results found for: BNP  Inpatient Medications  Scheduled Meds: . atenolol  100 mg Oral q morning - 10a  . dextrose  1 ampule Intravenous Once  . doxazosin  4 mg Oral QHS  . furosemide  40 mg Oral Daily  . levothyroxine  75 mcg Intravenous Daily  . [START ON 12/24/2017] pantoprazole  40 mg Intravenous Q12H   Continuous Infusions: . dextrose 5 % and 0.45 % NaCl with KCl 20 mEq/L 75 mL/hr at 12/22/17 0934  . pantoprozole (PROTONIX) infusion 8 mg/hr (12/23/17 0734)  . piperacillin-tazobactam (ZOSYN)  IV 3.375 g (12/23/17 1244)   PRN Meds:.acetaminophen, bisacodyl, hydrALAZINE, morphine injection, MUSCLE RUB  Micro Results No results found for this or any previous visit (from the past 240 hour(s)).  Radiology Reports Ct Abdomen Pelvis W Contrast  Result Date: 12/20/2017 CLINICAL DATA:  Lower abdominal pain for 2 days with symptoms of constipation EXAM: CT ABDOMEN AND PELVIS WITH CONTRAST TECHNIQUE: Multidetector CT imaging of the abdomen and pelvis was performed using the standard protocol following bolus administration of intravenous contrast. CONTRAST:  26mL OMNIPAQUE IOHEXOL 300 MG/ML  SOLN COMPARISON:  02/04/2010 FINDINGS: Lower chest: Lung bases demonstrate bilateral infiltrative opacities. Small effusions are noted bilaterally. Additionally there is a somewhat more rounded area  of increased density in the medial aspect of the left lower lobe. This measures 2.2 cm in transverse diameter and previously measured approximately 11 mm in transverse diameter. This has previously show no significant metabolic activity although the increase in size is somewhat suspicious. Short-term follow-up in 6 months is recommended. Hepatobiliary: Gallbladder is well distended with evidence of cholelithiasis. Some questionable pericholecystic fluid is noted. No wall thickening is seen. The liver is within normal limits Pancreas: Pancreas is well visualized. No definitive mass lesion is noted. Spleen: Normal in size without focal abnormality. Adrenals/Urinary Tract: Adrenal glands are stable in appearance with mild thickening. The kidneys demonstrate bilateral renal cystic change. No obstructive changes are noted. Bladder is partially distended. Stomach/Bowel: Scattered diverticular change of the colon is noted without evidence of diverticulitis. Moderate-sized hiatal hernia is noted increased when compare with the prior exam. Some foci of free air are noted adjacent to the liver as well as the first portion of the duodenum. Some indistinct tissue planes are noted in this region as well in the possibility of a perforated ulcer could not be totally excluded. Vascular/Lymphatic: Aortic atherosclerosis. No enlarged abdominal or pelvic lymph nodes. Reproductive: Status post hysterectomy. No adnexal masses. Other: No abdominal wall hernia or abnormality. No abdominopelvic ascites. Musculoskeletal: Bilateral superior and inferior pubic rami fractures are noted likely related to the patient's given clinical history of recent fall. Bilateral sacral fractures are noted as well. Only minimal displacement at the fracture sites is seen.  There is a right transverse process fracture at L5 no rib abnormalities are noted. Severe degenerative changes of lumbar spine are noted without definitive compression deformity. IMPRESSION:  Minimal free air in the upper abdomen adjacent to the liver some indistinct tissue planes are noted in the region of the proximal duodenum. The possibility of a minimally perforated duodenal ulcer could not be totally excluded. Multiple pelvic fractures as described above consistent with the patient's given clinical history of recent fall. Increase in size of soft tissue nodule within the medial aspect of the left lung base when compared with previous exams dating back to 2011. This previously showed no significant metabolic activity on PET-CT. The increase in size is somewhat suspicious however and follow-up examination in 3-6 months is recommended Bilateral lower lobe infiltrative changes. Small effusions are noted as well. Cholelithiasis with some suggestion of pericholecystic fluid. This may be related to the adjacent duodenal changes. Critical Value/emergent results were called by telephone at the time of interpretation on 12/20/2017 at 6:25 am to Dr. Pryor Curia , who verbally acknowledged these results. Electronically Signed   By: Inez Catalina M.D.   On: 12/20/2017 06:24   Dg Duanne Limerick W/water Sol Cm  Result Date: 12/23/2017 CLINICAL DATA:  Patient presents for water-soluble upper GI evaluation for a possible perforated ulcer. Patient underwent CT evaluation on 12/20/2017, which showed a small amount of free air. A possible per serrated duodenal ulcer with suggested. Current exam is significantly limited by the patient's significant back disease. She was unable to lay flat or prone. She is significantly kyphotic. The exam was performed with the patient standing, with areas right lateral obliquities performed to assess the gastric antrum and duodenum. Patient was only able to stand for approximately 5 minutes. EXAM: WATER SOLUBLE UPPER GI SERIES TECHNIQUE: Single-column upper GI series was performed using water soluble contrast. CONTRAST:  17mL OMNIPAQUE IOHEXOL 300 MG/ML  SOLN COMPARISON:  None. FLUOROSCOPY  TIME:  Fluoroscopy Time:  1 minutes and 18 seconds Radiation Exposure Index (if provided by the fluoroscopic device): 14 188 micro Gy per meter squared. Number of Acquired Spot Images: 17 FINDINGS: The gastric antrum appears narrowed with fold thickening. There is fold thickening noted along the duodenal bulb and second portion of the duodenum. No contrast extravasation is seen to defined ulcer. Contrast extended promptly from the stomach into the small bowel. No evidence of obstruction. IMPRESSION: 1. Limited water-soluble single contrast upper GI. There are findings consistent with inflammation of the gastric antrum and proximal duodenum. No discrete ulcer was defined on this exam and there was no contrast extravasation to indicate a perforated ulcer. Electronically Signed   By: [name] M.D.   On: 12/23/2017 09:45     Phillips Climes M.D on 12/23/2017 at 1:56 PM  Between 7am to 7pm - Pager - [phone]  After 7pm go to [email or web address] - password [facility]  [facility] -  Office  [phone]

## 2017-12-23 NOTE — Progress Notes (Signed)
Orthopedic Tech Progress Note Patient Details:  [name] 05-28-1926 [id number]  Ortho Devices Type of Ortho Device: Ace wrap, Unna boot Ortho Device/Splint Interventions: Application   Post Interventions Patient Tolerated: Well Instructions Provided: Care of device   [name] 12/23/2017, 5:20 PM

## 2017-12-23 NOTE — Consult Note (Addendum)
[facility] Nurse wound consult note Reason for Consult: Consult requested for bilat legs.  Pt has been wearing bilat Una boots prior to admission.  She states they are very tight and painful at this time.  Removed to assess legs and there are no open wounds or drainage which indicate the need for continued use of Una boots.  Bialt feet with generalized edema below where the Una boots were located, but legs without swelling or weeping.   Wound type: Left posterior leg with partial thickness wound; .2X.2X.1cm, small amt bloody drainage.  Dressing procedure/placement/frequency: Plan to discontinue use of Una boots at this time.  Foam dressing to protect left posterior leg from further injury. Discussed plan of care with patient, no family present at the bedside. Please re-consult if further assistance is needed.  Thank-you,  Julien Girt MSN, [place], [place], [place], [place]

## 2017-12-23 NOTE — Progress Notes (Signed)
Pharmacy Antibiotic Note  [name] is a 84 y.o. female admitted on 12/19/2017 with intra-abdominal infection.  Pharmacy has been consulted for zosyn dosing.    Pt has been on conservative management of her contained duodenal ulcer perforation. Crcl ~ 26 ml/min. Will continue zosyn at current dose.  Plan: Zosyn 3.375gm IV Q8H (4 hr inf) F/u renal fxn, C&S, clinical status   Height: 5' (152.4 cm) Weight: 130 lb 1.1 oz (59 kg) IBW/kg (Calculated) : 45.5  Temp (24hrs), Avg:98.1 F (36.7 C), Min:97.7 F (36.5 C), Max:98.4 F (36.9 C)  Recent Labs  Lab 12/19/17 2300 12/21/17 0331 12/21/17 1526 12/22/17 0600 12/23/17 0527  WBC 11.5* 11.3* 9.0 10.1 7.9  CREATININE 1.25* 1.20*  --  1.32* 1.19*    Estimated Creatinine Clearance: 25.8 mL/min (A) (by C-G formula based on SCr of 1.19 mg/dL (H)).    Allergies  Allergen Reactions  . Aspirin Other (See Comments)    Stomach bleed    Antimicrobials this admission: Zosyn 9/27>>  Dose adjustments this admission: N/A  Microbiology results: None  Onnie Boer, PharmD, BCIDP, AAHIVP, CPP Infectious Disease Pharmacist Pager: [phone] 12/23/2017 9:36 AM

## 2017-12-24 MED ORDER — LOPERAMIDE HCL 2 MG PO CAPS
2.0000 mg | ORAL_CAPSULE | ORAL | Status: DC | PRN
  Filled 2017-12-24: qty 1

## 2017-12-24 MED ORDER — PANTOPRAZOLE SODIUM 40 MG PO TBEC
40.0000 mg | DELAYED_RELEASE_TABLET | Freq: Two times a day (BID) | ORAL | Status: DC
  Administered 2017-12-24 – 2017-12-25 (×3): 40 mg via ORAL
  Filled 2017-12-24 (×3): qty 1

## 2017-12-24 MED ORDER — LEVOTHYROXINE SODIUM 25 MCG PO TABS
125.0000 ug | ORAL_TABLET | Freq: Every day | ORAL | Status: DC
  Administered 2017-12-24 – 2017-12-25 (×2): 125 ug via ORAL
  Filled 2017-12-24 (×2): qty 1

## 2017-12-24 MED ORDER — AMOXICILLIN-POT CLAVULANATE 500-125 MG PO TABS
1.0000 | ORAL_TABLET | Freq: Two times a day (BID) | ORAL | Status: DC
  Administered 2017-12-24 – 2017-12-25 (×3): 500 mg via ORAL
  Filled 2017-12-24 (×3): qty 1

## 2017-12-24 MED ORDER — SUCRALFATE 1 GM/10ML PO SUSP
1.0000 g | Freq: Three times a day (TID) | ORAL | Status: DC
  Administered 2017-12-24 – 2017-12-25 (×4): 1 g via ORAL
  Filled 2017-12-24 (×4): qty 10

## 2017-12-24 NOTE — Progress Notes (Signed)
PROGRESS NOTE                                                                                                                                                                                                             Patient Demographics:    [name], is a 84 y.o. female, DOB - February 16, 1927, ZRA:[id number]  Admit date - 12/19/2017   Admitting Physician Sid Falcon, MD  Outpatient Primary MD for the patient is [name], MD  LOS - 4   Chief Complaint  Patient presents with  . Abdominal Pain       Brief Narrative    84 y.o. female with medical history significant of hypothyroidism, spinal stenosis/kyphosis, HTN, HLD, h/o breast cancer, arthritis who presented with abdominal pain, imaging was significant for serrated peptic ulcer, she was seen by general surgery, recommendation has been made for operative management, she was kept n.p.o., on IV fluids and IV Zosyn and Protonix, patient continues to improve, UGI study 11/2017 with no evidence of leak, so she was started on clear liquid diet, diet being advanced gradually.   Subjective:    [name] today has, No headache, No chest pain, he denies abdominal pain, nausea or vomiting, no significant events overnight .   Assessment  & Plan :    Active Problems:   Duodenal ulcer perforation (HCC)   Intra-abdominal free air of unknown etiology   Duodenal ulcer perforation -Management primarily by general surgery, commendations is for nonoperative management -Initially n.p.o., on IV Zosyn, and IV Protonix drip, improving, leukocytosis trending down, abdominal pain has significantly improved, UGI showing no leak, so she was started on clear liquid diet 12/23/2017 which she has been tolerating, currently advance to soft diet. -IV Zosyn transition to oral Augmentin, need total of 14 days treatment. -We will switch from IV Protonix to p.o. Protonix, will add Carafate as well. -With  hypoglycemia when she was n.p.o., now she is tolerating oral we will DC IV fluids -Need GI follow-up as an outpatient to have EGD done when more stable to evaluate for ulcer or mass. -Continue to hold Mobic on discharge   acute blood loss anemia  -Hemoglobin has dropped during hospital stay, some delusional factor, as well possibly related to upper GI bleed from her perforated ulcer, I have discussed with general surgery, and this point, EGD  is not recommended in the setting of perforated duodenal ulcer, continue with supportive measures and transfuse as needed,  and she will need endoscopy in outpatient setting when more stable after 80-month to rule out bleed or tumor . -Resume iron supplements on discharge  Pelvic Fracture - Orthopedics consulted, she is WBAT bilaterally -Seen by PT, recommendation for SNF  Hypothyroidism - Change to IV synthroid, half of home dose, 56mcg IV daily  Spinal stenosis/arthritis - Pain control with low dose morphine prn - Needs bowel regimen PR  HTN -Pressure started to increase, now she can take oral will resume atenolol and Cardura, continue to hold lisinopril number resumed on low-dose Lasix  Patient has bilateral Unna boots, seen by wound care, no open ulcers or wound, which has been discontinued, was on Lasix 40 mg oral twice daily, she is resumed on 40 once daily currently, increase when her oral intake improves.  Code Status : Full  Family Communication  : None at bedside  Disposition Plan  : Can be discharged tomorrow to SNF if tolerating soft diet and hemoglobin is stable  Consults  :  General surgery  Procedures  : None  DVT Prophylaxis  :  SCD, hold chemical anticoagulation in the setting of perforated peptic ulcer  Lab Results  Component Value Date   PLT 318 12/23/2017    Antibiotics  :   Anti-infectives (From admission, onward)   Start     Dose/Rate Route Frequency Ordered Stop   12/24/17 1030  amoxicillin-clavulanate  (AUGMENTIN) 500-125 MG per tablet 500 mg     1 tablet Oral 2 times daily 12/24/17 1023 01/04/18 0959   12/20/17 1300  piperacillin-tazobactam (ZOSYN) IVPB 3.375 g  Status:  Discontinued     3.375 g 12.5 mL/hr over 240 Minutes Intravenous Every 8 hours 12/20/17 1011 12/24/17 1023   12/20/17 0645  piperacillin-tazobactam (ZOSYN) IVPB 3.375 g     3.375 g 100 mL/hr over 30 Minutes Intravenous  Once 12/20/17 0644 12/20/17 0757        Objective:   Vitals:   12/23/17 1404 12/23/17 2037 12/24/17 0533 12/24/17 1324  BP: (!) 187/86 (!) 174/81 (!) 137/58 132/74  Pulse: 66 (!) 57 (!) 58 (!) 57  Resp: 18 18 16 18   Temp: 97.7 F (36.5 C) 97.9 F (36.6 C) 97.7 F (36.5 C) (!) 97.5 F (36.4 C)  TempSrc: Oral Oral Oral   SpO2: 97% 96% 97% 99%  Weight:      Height:        Wt Readings from Last 3 Encounters:  12/20/17 59 kg  10/09/16 59 kg  11/01/14 60.8 kg     Intake/Output Summary (Last 24 hours) at 12/24/2017 1405 Last data filed at 12/24/2017 0900 Gross per 24 hour  Intake 1889.56 ml  Output -  Net 1889.56 ml     Physical Exam  Awake Alert, pleasant, appropriate, sitting in recliner, hearing significantly improved as using her hearing aids today Symmetrical Chest wall movement, Good air movement bilaterally, CTAB RRR,No Gallops,Rubs or new Murmurs, No Parasternal Heave +ve B.Sounds, Abd Soft, epigastric tenderness almost resolved, No rebound - guarding or rigidity. She has Unna boots bilaterally       Data Review:    CBC Recent Labs  Lab 12/19/17 2300 12/21/17 0331 12/21/17 1526 12/21/17 2117 12/22/17 0600 12/23/17 0527  WBC 11.5* 11.3* 9.0  --  10.1 7.9  HGB 9.0* 7.8* 7.6* 8.2* 8.0* 8.4*  HCT 28.7* 24.7* 23.9* 26.0* 25.5* 26.4*  PLT 327  300 285  --  321 318  MCV 96.3 95.7 96.0  --  97.0 96.0  MCH 30.2 30.2 30.5  --  30.4 30.5  MCHC 31.4 31.6 31.8  --  31.4 31.8  RDW 15.9* 15.9* 15.9*  --  15.8* 15.6*    Chemistries  Recent Labs  Lab 12/19/17 2300  12/21/17 0331 12/22/17 0600 12/23/17 0527  NA 131* 134* 138 138  K 3.8 3.3* 3.4* 3.4*  CL 98 102 105 107  CO2 25 23 21* 23  GLUCOSE 132* 60* 37* 74  BUN 18 22 25* 19  CREATININE 1.25* 1.20* 1.32* 1.19*  CALCIUM 8.0* 7.7* 7.7* 7.9*  AST 15  --   --   --   ALT 8  --   --   --   ALKPHOS 118  --   --   --   BILITOT 0.5  --   --   --    ------------------------------------------------------------------------------------------------------------------ No results for input(s): CHOL, HDL, LDLCALC, TRIG, CHOLHDL, LDLDIRECT in the last 72 hours.  No results found for: HGBA1C ------------------------------------------------------------------------------------------------------------------ No results for input(s): TSH, T4TOTAL, T3FREE, THYROIDAB in the last 72 hours.  Invalid input(s): FREET3 ------------------------------------------------------------------------------------------------------------------ Recent Labs    12/23/17 0527  VITAMINB12 828  FOLATE 7.1  FERRITIN 325*  TIBC 176*  IRON 52  RETICCTPCT 1.3    Coagulation profile No results for input(s): INR, PROTIME in the last 168 hours.  No results for input(s): DDIMER in the last 72 hours.  Cardiac Enzymes No results for input(s): CKMB, TROPONINI, MYOGLOBIN in the last 168 hours.  Invalid input(s): CK ------------------------------------------------------------------------------------------------------------------ No results found for: BNP  Inpatient Medications  Scheduled Meds: . amoxicillin-clavulanate  1 tablet Oral BID  . atenolol  100 mg Oral q morning - 10a  . dextrose  1 ampule Intravenous Once  . doxazosin  4 mg Oral QHS  . feeding supplement (ENSURE ENLIVE)  237 mL Oral BID BM  . furosemide  40 mg Oral Daily  . levothyroxine  125 mcg Oral QAC breakfast  . pantoprazole  40 mg Intravenous Q12H   Continuous Infusions: . dextrose 5 % and 0.45 % NaCl with KCl 20 mEq/L 750 mL (12/23/17 2232)   PRN  Meds:.acetaminophen, bisacodyl, hydrALAZINE, loperamide, morphine injection, MUSCLE RUB  Micro Results No results found for this or any previous visit (from the past 240 hour(s)).  Radiology Reports Ct Abdomen Pelvis W Contrast  Result Date: 12/20/2017 CLINICAL DATA:  Lower abdominal pain for 2 days with symptoms of constipation EXAM: CT ABDOMEN AND PELVIS WITH CONTRAST TECHNIQUE: Multidetector CT imaging of the abdomen and pelvis was performed using the standard protocol following bolus administration of intravenous contrast. CONTRAST:  7mL OMNIPAQUE IOHEXOL 300 MG/ML  SOLN COMPARISON:  02/04/2010 FINDINGS: Lower chest: Lung bases demonstrate bilateral infiltrative opacities. Small effusions are noted bilaterally. Additionally there is a somewhat more rounded area of increased density in the medial aspect of the left lower lobe. This measures 2.2 cm in transverse diameter and previously measured approximately 11 mm in transverse diameter. This has previously show no significant metabolic activity although the increase in size is somewhat suspicious. Short-term follow-up in 6 months is recommended. Hepatobiliary: Gallbladder is well distended with evidence of cholelithiasis. Some questionable pericholecystic fluid is noted. No wall thickening is seen. The liver is within normal limits Pancreas: Pancreas is well visualized. No definitive mass lesion is noted. Spleen: Normal in size without focal abnormality. Adrenals/Urinary Tract: Adrenal glands are stable in appearance with mild thickening.  The kidneys demonstrate bilateral renal cystic change. No obstructive changes are noted. Bladder is partially distended. Stomach/Bowel: Scattered diverticular change of the colon is noted without evidence of diverticulitis. Moderate-sized hiatal hernia is noted increased when compare with the prior exam. Some foci of free air are noted adjacent to the liver as well as the first portion of the duodenum. Some indistinct  tissue planes are noted in this region as well in the possibility of a perforated ulcer could not be totally excluded. Vascular/Lymphatic: Aortic atherosclerosis. No enlarged abdominal or pelvic lymph nodes. Reproductive: Status post hysterectomy. No adnexal masses. Other: No abdominal wall hernia or abnormality. No abdominopelvic ascites. Musculoskeletal: Bilateral superior and inferior pubic rami fractures are noted likely related to the patient's given clinical history of recent fall. Bilateral sacral fractures are noted as well. Only minimal displacement at the fracture sites is seen. There is a right transverse process fracture at L5 no rib abnormalities are noted. Severe degenerative changes of lumbar spine are noted without definitive compression deformity. IMPRESSION: Minimal free air in the upper abdomen adjacent to the liver some indistinct tissue planes are noted in the region of the proximal duodenum. The possibility of a minimally perforated duodenal ulcer could not be totally excluded. Multiple pelvic fractures as described above consistent with the patient's given clinical history of recent fall. Increase in size of soft tissue nodule within the medial aspect of the left lung base when compared with previous exams dating back to 2011. This previously showed no significant metabolic activity on PET-CT. The increase in size is somewhat suspicious however and follow-up examination in 3-6 months is recommended Bilateral lower lobe infiltrative changes. Small effusions are noted as well. Cholelithiasis with some suggestion of pericholecystic fluid. This may be related to the adjacent duodenal changes. Critical Value/emergent results were called by telephone at the time of interpretation on 12/20/2017 at 6:25 am to Dr. Pryor Curia , who verbally acknowledged these results. Electronically Signed   By: Inez Catalina M.D.   On: 12/20/2017 06:24   Dg Duanne Limerick W/water Sol Cm  Result Date: 12/23/2017 CLINICAL DATA:   Patient presents for water-soluble upper GI evaluation for a possible perforated ulcer. Patient underwent CT evaluation on 12/20/2017, which showed a small amount of free air. A possible per serrated duodenal ulcer with suggested. Current exam is significantly limited by the patient's significant back disease. She was unable to lay flat or prone. She is significantly kyphotic. The exam was performed with the patient standing, with areas right lateral obliquities performed to assess the gastric antrum and duodenum. Patient was only able to stand for approximately 5 minutes. EXAM: WATER SOLUBLE UPPER GI SERIES TECHNIQUE: Single-column upper GI series was performed using water soluble contrast. CONTRAST:  81mL OMNIPAQUE IOHEXOL 300 MG/ML  SOLN COMPARISON:  None. FLUOROSCOPY TIME:  Fluoroscopy Time:  1 minutes and 18 seconds Radiation Exposure Index (if provided by the fluoroscopic device): 14 188 micro Gy per meter squared. Number of Acquired Spot Images: 17 FINDINGS: The gastric antrum appears narrowed with fold thickening. There is fold thickening noted along the duodenal bulb and second portion of the duodenum. No contrast extravasation is seen to defined ulcer. Contrast extended promptly from the stomach into the small bowel. No evidence of obstruction. IMPRESSION: 1. Limited water-soluble single contrast upper GI. There are findings consistent with inflammation of the gastric antrum and proximal duodenum. No discrete ulcer was defined on this exam and there was no contrast extravasation to indicate a perforated ulcer. Electronically Signed  By: [name] M.D.   On: 12/23/2017 09:45     Phillips Climes M.D on 12/24/2017 at 2:05 PM  Between 7am to 7pm - Pager - [phone]  After 7pm go to [email or web address] - password [facility]  [facility] -  Office  [phone]

## 2017-12-24 NOTE — Progress Notes (Signed)
Patient ID: [name], female   DOB: 10-27-1926, 84 y.o.   MRN: [id number]       Subjective: Feels well.  No new complaints.  Tolerated clear liquids well yesterday with no issues.  Objective: Vital signs in last 24 hours: Temp:  [97.7 F (36.5 C)-97.9 F (36.6 C)] 97.7 F (36.5 C) (10/01 0533) Pulse Rate:  [57-66] 58 (10/01 0533) Resp:  [16-18] 16 (10/01 0533) BP: (137-187)/(58-86) 137/58 (10/01 0533) SpO2:  [96 %-97 %] 97 % (10/01 0533) Last BM Date: 12/23/17  Intake/Output from previous day: 09/30 0701 - 10/01 0700 In: 2129.6 [P.O.:480; I.V.:1571; IV Piggyback:78.5] Out: -  Intake/Output this shift: No intake/output data recorded.  PE: Abd: soft, minimally tender, +BS, ND  Lab Results:  Recent Labs    12/22/17 0600 12/23/17 0527  WBC 10.1 7.9  HGB 8.0* 8.4*  HCT 25.5* 26.4*  PLT 321 318   BMET Recent Labs    12/22/17 0600 12/23/17 0527  NA 138 138  K 3.4* 3.4*  CL 105 107  CO2 21* 23  GLUCOSE 37* 74  BUN 25* 19  CREATININE 1.32* 1.19*  CALCIUM 7.7* 7.9*   PT/INR No results for input(s): LABPROT, INR in the last 72 hours. CMP     Component Value Date/Time   NA 138 12/23/2017 0527   K 3.4 (L) 12/23/2017 0527   CL 107 12/23/2017 0527   CO2 23 12/23/2017 0527   GLUCOSE 74 12/23/2017 0527   BUN 19 12/23/2017 0527   CREATININE 1.19 (H) 12/23/2017 0527   CALCIUM 7.9 (L) 12/23/2017 0527   PROT 5.5 (L) 12/19/2017 2300   ALBUMIN 2.9 (L) 12/19/2017 2300   AST 15 12/19/2017 2300   ALT 8 12/19/2017 2300   ALKPHOS 118 12/19/2017 2300   BILITOT 0.5 12/19/2017 2300   GFRNONAA 39 (L) 12/23/2017 0527   GFRAA 46 (L) 12/23/2017 0527   Lipase     Component Value Date/Time   LIPASE 25 12/19/2017 2300       Studies/Results: Dg Ugi W/water Sol Cm  Result Date: 12/23/2017 CLINICAL DATA:  Patient presents for water-soluble upper GI evaluation for a possible perforated ulcer. Patient underwent CT evaluation on 12/20/2017, which showed a small  amount of free air. A possible per serrated duodenal ulcer with suggested. Current exam is significantly limited by the patient's significant back disease. She was unable to lay flat or prone. She is significantly kyphotic. The exam was performed with the patient standing, with areas right lateral obliquities performed to assess the gastric antrum and duodenum. Patient was only able to stand for approximately 5 minutes. EXAM: WATER SOLUBLE UPPER GI SERIES TECHNIQUE: Single-column upper GI series was performed using water soluble contrast. CONTRAST:  39mL OMNIPAQUE IOHEXOL 300 MG/ML  SOLN COMPARISON:  None. FLUOROSCOPY TIME:  Fluoroscopy Time:  1 minutes and 18 seconds Radiation Exposure Index (if provided by the fluoroscopic device): 14 188 micro Gy per meter squared. Number of Acquired Spot Images: 17 FINDINGS: The gastric antrum appears narrowed with fold thickening. There is fold thickening noted along the duodenal bulb and second portion of the duodenum. No contrast extravasation is seen to defined ulcer. Contrast extended promptly from the stomach into the small bowel. No evidence of obstruction. IMPRESSION: 1. Limited water-soluble single contrast upper GI. There are findings consistent with inflammation of the gastric antrum and proximal duodenum. No discrete ulcer was defined on this exam and there was no contrast extravasation to indicate a perforated ulcer. Electronically Signed  By: [name] M.D.   On: 12/23/2017 09:45    Anti-infectives: Anti-infectives (From admission, onward)   Start     Dose/Rate Route Frequency Ordered Stop   12/20/17 1300  piperacillin-tazobactam (ZOSYN) IVPB 3.375 g     3.375 g 12.5 mL/hr over 240 Minutes Intravenous Every 8 hours 12/20/17 1011     12/20/17 0645  piperacillin-tazobactam (ZOSYN) IVPB 3.375 g     3.375 g 100 mL/hr over 30 Minutes Intravenous  Once 12/20/17 0644 12/20/17 0757       Assessment/Plan Likely contained perforation of duodenal  ulcer -adv to soft diet -abx therapy for total of 14 days.  Today is day 5 of zosyn, given normal wBC and no fevers, can switch to oral augmentin. -add carafate for tx of ulcer disease -follow up with Dr. Georgette Dover as an outpatient.  L5 transverse process fracture, bilateral sacral fracture, pubic rami fractures -follow up with [name] as per ortho reqs. -mobilize as able Multiple medical problems  FEN -soft diet VTE -SCDs/ heparin ID -zosyn 9/27 -->can DC and start oral abx for total of 14 days.  D/W Dr. Waldron Labs   LOS: 4 days    Henreitta Cea , [facility] 12/24/2017, 9:44 AM Pager: [phone]

## 2017-12-24 NOTE — Clinical Social Work Note (Signed)
Clinical Social Work Assessment  Patient Details  Name: [name] MRN: [id number] Date of Birth: [birth year]  Date of referral:  12/24/17               Reason for consult:  Facility Placement                Permission sought to share information with:  Facility Sport and exercise psychologist, Family Supports Permission granted to share information::  Yes, Verbal Permission Granted  Name::     Therapist, occupational::  SNFs  Relationship::  Son  Contact Information:  [phone]  Housing/Transportation Living arrangements for the past 2 months:  [place] of Information:  Adult Children Patient Interpreter Needed:  None Criminal Activity/Legal Involvement Pertinent to Current Situation/Hospitalization:  No - Comment as needed Significant Relationships:  Adult Children Lives with:  Self Do you feel safe going back to the place where you live?  No Need for family participation in patient care:  Yes (Comment)  Care giving concerns:  CSW received consult for possible SNF placement at time of discharge. CSW spoke with patient's son and daughter regarding PT recommendation of SNF placement at time of discharge. Patient's son reported that patient's family is currently unable to care for patient at their home given patient's current physical needs and fall risk. Patient's son expressed understanding of PT recommendation and is agreeable to SNF placement at time of discharge. CSW to continue to follow and assist with discharge planning needs.   Social Worker assessment / plan:  CSW spoke with patient's son and daughter concerning possibility of rehab at [facility] before returning home.  Employment status:  Retired Nurse, adult PT Recommendations:  [place] / Referral to community resources:  [place]  Patient/Family's Response to care:  Patient's son and daughter recognizes need for rehab before returning home and is  agreeable to a SNF in [place]. Patient's granddaughter is a Education officer, museum and reported preference for a private room at [employer]. CSW explained insurance approval process.   Patient/Family's Understanding of and Emotional Response to Diagnosis, Current Treatment, and Prognosis:  Patient/family is realistic regarding therapy needs and expressed being hopeful for SNF placement. Patient's son expressed understanding of CSW role and discharge process as well as medical condition. No questions/concerns about plan or treatment.    Emotional Assessment Appearance:  Appears stated age Attitude/Demeanor/Rapport:  Unable to Assess Affect (typically observed):  Unable to Assess Orientation:  Oriented to Self, Oriented to Place Alcohol / Substance use:  Not Applicable Psych involvement (Current and /or in the community):  No (Comment)  Discharge Needs  Concerns to be addressed:  Care Coordination Readmission within the last 30 days:  No Current discharge risk:  None Barriers to Discharge:  Continued Medical Work up   [employer], LCSW 12/24/2017, 11:01 AM

## 2017-12-24 NOTE — Progress Notes (Signed)
Pharmacy Antibiotic Note  [name] is a 84 y.o. female admitted on 12/19/2017 with intra-abdominal infection.  Pharmacy has been consulted for zosyn dosing.    Pt has been on conservative management of her contained duodenal ulcer perforation. Crcl ~ 26 ml/min. She has improved so CCS said ok to change to PO abx. D/w Dr. Waldron Labs and we will change to Augmentin to complete 14d.   Plan: Dc zosyn Augmentin 500mg  PO BID to complete 14d  Height: 5' (152.4 cm) Weight: 130 lb 1.1 oz (59 kg) IBW/kg (Calculated) : 45.5  Temp (24hrs), Avg:97.8 F (36.6 C), Min:97.7 F (36.5 C), Max:97.9 F (36.6 C)  Recent Labs  Lab 12/19/17 2300 12/21/17 0331 12/21/17 1526 12/22/17 0600 12/23/17 0527  WBC 11.5* 11.3* 9.0 10.1 7.9  CREATININE 1.25* 1.20*  --  1.32* 1.19*    Estimated Creatinine Clearance: 25.8 mL/min (A) (by C-G formula based on SCr of 1.19 mg/dL (H)).    Allergies  Allergen Reactions  . Aspirin Other (See Comments)    Stomach bleed    Antimicrobials this admission: Zosyn 9/27>>10/1 Augmentin 10/1>>10/12  Dose adjustments this admission: N/A  Microbiology results: None  Onnie Boer, PharmD, BCIDP, AAHIVP, CPP Infectious Disease Pharmacist Pager: [phone] 12/24/2017 10:24 AM

## 2017-12-24 NOTE — Discharge Instructions (Signed)
Food Choices for Peptic Ulcer Disease When you have peptic ulcer disease, the foods you eat and your eating habits are very important. Choosing the right foods can help ease the discomfort of peptic ulcer disease. What general guidelines do I need to follow?  Choose fruits, vegetables, whole grains, and low-fat meat, fish, and poultry.  Keep a food diary to identify foods that cause symptoms.  Avoid foods that cause irritation or pain. These may be different for different people.  Eat frequent small meals instead of three large meals each day. The pain may be worse when your stomach is empty.  Avoid eating close to bedtime. What foods are not recommended? The following are some foods and drinks that may worsen your symptoms:  Black, white, and red pepper.  Hot sauce.  Chili peppers.  Chili powder.  Chocolate and cocoa.  Alcohol.  Tea, coffee, and cola (regular and decaffeinated).  The items listed above may not be a complete list of foods and beverages to avoid. Contact your dietitian for more information. This information is not intended to replace advice given to you by your health care provider. Make sure you discuss any questions you have with your health care provider. Document Released: 06/04/2011 Document Revised: 08/18/2015 Document Reviewed: 01/14/2013 Elsevier Interactive Patient Education  2017 [employer].

## 2017-12-25 DIAGNOSIS — S3210XA Unspecified fracture of sacrum, initial encounter for closed fracture: Secondary | ICD-10-CM

## 2017-12-25 LAB — CBC
HEMATOCRIT: 25.8 % — AB (ref 36.0–46.0)
Hemoglobin: 8.3 g/dL — ABNORMAL LOW (ref 12.0–15.0)
MCH: 30.2 pg (ref 26.0–34.0)
MCHC: 32.2 g/dL (ref 30.0–36.0)
MCV: 93.8 fL (ref 78.0–100.0)
Platelets: 314 10*3/uL (ref 150–400)
RBC: 2.75 MIL/uL — ABNORMAL LOW (ref 3.87–5.11)
RDW: 15 % (ref 11.5–15.5)
WBC: 8.5 10*3/uL (ref 4.0–10.5)

## 2017-12-25 LAB — BASIC METABOLIC PANEL
ANION GAP: 5 (ref 5–15)
BUN: 12 mg/dL (ref 8–23)
CALCIUM: 7.3 mg/dL — AB (ref 8.9–10.3)
CO2: 27 mmol/L (ref 22–32)
Chloride: 103 mmol/L (ref 98–111)
Creatinine, Ser: 1.12 mg/dL — ABNORMAL HIGH (ref 0.44–1.00)
GFR, EST AFRICAN AMERICAN: 49 mL/min — AB (ref >60)
GFR, EST NON AFRICAN AMERICAN: 42 mL/min — AB (ref >60)
Glucose, Bld: 101 mg/dL — ABNORMAL HIGH (ref 70–99)
Potassium: 2.9 mmol/L — ABNORMAL LOW (ref 3.5–5.1)
SODIUM: 135 mmol/L (ref 135–145)

## 2017-12-25 MED ORDER — POTASSIUM CHLORIDE CRYS ER 20 MEQ PO TBCR
40.0000 meq | EXTENDED_RELEASE_TABLET | Freq: Every day | ORAL | Status: DC
  Administered 2017-12-25: 40 meq via ORAL
  Filled 2017-12-25: qty 2

## 2017-12-25 MED ORDER — POTASSIUM CHLORIDE CRYS ER 20 MEQ PO TBCR: 40.0000 meq | EXTENDED_RELEASE_TABLET | Freq: Every day | ORAL | 0 refills | Status: DC

## 2017-12-25 MED ORDER — AMOXICILLIN-POT CLAVULANATE 500-125 MG PO TABS: 1.0000 | ORAL_TABLET | Freq: Two times a day (BID) | ORAL | 0 refills | Status: DC

## 2017-12-25 MED ORDER — ESOMEPRAZOLE MAGNESIUM 40 MG PO CPDR: 40.0000 mg | DELAYED_RELEASE_CAPSULE | Freq: Two times a day (BID) | ORAL | 0 refills | Status: DC

## 2017-12-25 MED ORDER — LISINOPRIL 10 MG PO TABS: 10.0000 mg | ORAL_TABLET | Freq: Every morning | ORAL | 0 refills | Status: DC

## 2017-12-25 MED ORDER — ENSURE ENLIVE PO LIQD: 237.0000 mL | Freq: Two times a day (BID) | ORAL | 0 refills | Status: AC

## 2017-12-25 MED ORDER — SUCRALFATE 1 GM/10ML PO SUSP: 1.0000 g | Freq: Three times a day (TID) | ORAL | 0 refills | Status: AC

## 2017-12-25 MED ORDER — ESOMEPRAZOLE MAGNESIUM 40 MG PO CPDR: 40.0000 mg | DELAYED_RELEASE_CAPSULE | Freq: Every day | ORAL | 0 refills | Status: DC

## 2017-12-25 MED ORDER — TRAMADOL HCL 50 MG PO TABS: 50.0000 mg | ORAL_TABLET | Freq: Two times a day (BID) | ORAL | 0 refills | Status: DC | PRN

## 2017-12-25 NOTE — Clinical Social Work Placement (Signed)
   CLINICAL SOCIAL WORK PLACEMENT  NOTE  Date:  12/25/2017  Patient Details  Name: [name] MRN: [id number] Date of Birth: 1926-11-20  Clinical Social Work is seeking post-discharge placement for this patient at the [place] level of care (*CSW will initial, date and re-position this form in  chart as items are completed):  Yes   Patient/family provided with [facility]'s list of facilities offering this level of care within the geographic area requested by the patient (or if unable, by the patient's family).  Yes   Patient/family informed of their freedom to choose among providers that offer the needed level of care, that participate in Medicare, Medicaid or managed care program needed by the patient, have an available bed and are willing to accept the patient.  Yes   Patient/family informed of 's ownership interest in [facility] and [facility], as well as of the fact that they are under no obligation to receive care at these facilities.  PASRR submitted to [facility] on 12/23/17     PASRR number received on 12/23/17     Existing PASRR number confirmed on       FL2 transmitted to all facilities in geographic area requested by pt/family on 12/23/17     FL2 transmitted to all facilities within larger geographic area on       Patient informed that his/her managed care company has contracts with or will negotiate with certain facilities, including the following:        Yes   Patient/family informed of bed offers received.  Patient chooses bed at [facility]     Physician recommends and patient chooses bed at      Patient to be transferred to [facility] on 12/25/17.  Patient to be transferred to facility by [facility]     Patient family notified on 12/25/17 of transfer.  Name of family member notified:  Yvone Neu, son     PHYSICIAN       Additional Comment:     _______________________________________________ Benard Halsted, LCSW 12/25/2017, 10:48 AM

## 2017-12-25 NOTE — Discharge Summary (Signed)
PATIENT DETAILS Name: [name] Age: 84 y.o. Sex: female Date of Birth: 21-May-1926 MRN: [id number]. Admitting Physician: Sid Falcon, MD GMW:[id number], Christiane Ha, MD  Admit Date: 12/19/2017 Discharge date: 12/25/2017  Recommendations for Outpatient Follow-up:  1. Follow up with PCP in 1-2 weeks 2. Please obtain BMP/CBC in one week 3. Please ensure follow-up with gastroenterology and general surgery.  Admitted From:  Home  Disposition: SNF   Home Health: No  Equipment/Devices: None  Discharge Condition: Stable  CODE STATUS: FULL CODE  Diet recommendation:  Heart Healthy  Brief Summary: See H&P, Labs, Consult and Test reports for all details in brief, patient is a frail 84 year old female with history of hypothyroidism, hypertension, history of breast cancer who was admitted for evaluation of abdominal pain.  Further imaging showed a perforated peptic ulcer-thankfully this appeared to be a contained perforation-patient was managed with conservative medical treatment.  By day of discharge improved.  See below for further details  Brief Hospital Course: Duodenal ulcer with perforation: Likely this appeared to be a contained perforation-patient was managed conservatively with IV antibiotics, PPI infusion and n.p.o. status.  General surgery was consulted-upper GI series did not show any leak.  She was slowly started on a liquid diet and by day of discharge was tolerating a soft diet well.  Abdomen appears very benign on exam.  She has been transitioned to Augmentin-recommendations are to complete a 14-day course.  She will be also continued on PPI and Carafate.  She will need GI follow-up for endoscopic evaluation and will also need general surgery follow-up.  Avoid NSAIDs.  Anemia: Felt to be multifactorial in the setting of acute illness and perhaps some mild acute blood loss.  Continue to follow CBC closely.  Pelvic fracture: Orthopedics consulted-conservative care  recommended-WBAT as tolerated.  Hypothyroidism: Due to n.p.o. status-she was on IV Synthroid-but this has been discontinued and she has been transition to oral Synthroid.  Chronic back pain/spinal stenosis: Resume as needed tramadol on discharge  Hypertension: Stable resume usual antihypertensives on discharge  Hypokalemia: Secondary to Lasix-begin potassium supplementation-recheck electrolytes within 1 week  Chronic lower extremity swelling: Continue Unna boots on discharge.  Procedures/Studies: None  Discharge Diagnoses:  Active Problems:   Duodenal ulcer perforation ([place])   Intra-abdominal free air of unknown etiology   Discharge Instructions:  Activity:  As tolerated with Full fall precautions use walker/cane & assistance as needed  Discharge Instructions    (HEART FAILURE PATIENTS) Call MD:  Anytime you have any of the following symptoms: 1) 3 pound weight gain in 24 hours or 5 pounds in 1 week 2) shortness of breath, with or without a dry hacking cough 3) swelling in the hands, feet or stomach 4) if you have to sleep on extra pillows at night in order to breathe.   Complete by:  As directed    Diet - low sodium heart healthy   Complete by:  As directed    Stay on a soft diet for now.   Discharge instructions   Complete by:  As directed    Follow with Primary MD  [name], MD in 1 week  Please follow with gastroenterologist of your choice and also with general surgery.  Please get a complete blood count and chemistry panel checked by your Primary MD at your next visit, and again as instructed by your Primary MD.  Get Medicines reviewed and adjusted: Please take all your medications with you for your next visit with your Primary MD  Laboratory/radiological data: Please request your Primary MD to go over all hospital tests and procedure/radiological results at the follow up, please ask your Primary MD to get all Hospital records sent to his/her office.  In some  cases, they will be blood work, cultures and biopsy results pending at the time of your discharge. Please request that your primary care M.D. follows up on these results.  Also Note the following: If you experience worsening of your admission symptoms, develop shortness of breath, life threatening emergency, suicidal or homicidal thoughts you must seek medical attention immediately by calling 911 or calling your MD immediately  if symptoms less severe.  You must read complete instructions/literature along with all the possible adverse reactions/side effects for all the Medicines you take and that have been prescribed to you. Take any new Medicines after you have completely understood and accpet all the possible adverse reactions/side effects.   Do not drive when taking Pain medications or sleeping medications (Benzodaizepines)  Do not take more than prescribed Pain, Sleep and Anxiety Medications. It is not advisable to combine anxiety,sleep and pain medications without talking with your primary care practitioner  Special Instructions: If you have smoked or chewed Tobacco  in the last 2 yrs please stop smoking, stop any regular Alcohol  and or any Recreational drug use.  Wear Seat belts while driving.  Please note: You were cared for by a hospitalist during your hospital stay. Once you are discharged, your primary care physician will handle any further medical issues. Please note that NO REFILLS for any discharge medications will be authorized once you are discharged, as it is imperative that you return to your primary care physician (or establish a relationship with a primary care physician if you do not have one) for your post hospital discharge needs so that they can reassess your need for medications and monitor your lab values.   Increase activity slowly   Complete by:  As directed      Allergies as of 12/25/2017      Reactions   Aspirin Other (See Comments)   Stomach bleed        Medication List    STOP taking these medications   meloxicam 7.5 MG tablet Commonly known as:  MOBIC     TAKE these medications   acetaminophen 500 MG tablet Commonly known as:  TYLENOL Take 1,000 mg by mouth 2 (two) times daily.   amoxicillin-clavulanate 500-125 MG tablet Commonly known as:  AUGMENTIN Take 1 tablet (500 mg total) by mouth 2 (two) times daily.   atenolol 100 MG tablet Commonly known as:  TENORMIN Take 100 mg by mouth every morning.   doxazosin 4 MG tablet Commonly known as:  CARDURA Take 4 mg by mouth at bedtime.   esomeprazole 40 MG capsule Commonly known as:  NEXIUM Take 1 capsule (40 mg total) by mouth 2 (two) times daily before a meal. What changed:  when to take this   feeding supplement (ENSURE ENLIVE) Liqd Take 237 mLs by mouth 2 (two) times daily between meals.   furosemide 40 MG tablet Commonly known as:  LASIX Take 40 mg by mouth 2 (two) times daily.   hydrALAZINE 25 MG tablet Commonly known as:  APRESOLINE Take 50 mg by mouth 2 (two) times daily.   IRON SUPPLEMENT 325 (65 FE) MG tablet Generic drug:  ferrous sulfate Take 325 mg by mouth daily with breakfast.   levothyroxine 125 MCG tablet Commonly known as:  SYNTHROID, LEVOTHROID Take 125  mcg by mouth daily.   lisinopril 10 MG tablet Commonly known as:  PRINIVIL,ZESTRIL Take 1 tablet (10 mg total) by mouth every morning. What changed:    medication strength  how much to take   MUSCLE RUB 10-15 % Crea Apply 1 application topically as needed for muscle pain.   NON FORMULARY Apply 1 application topically daily as needed. Apply to back side  TOPICAL CREAM W/LIDOCAINE   potassium chloride SA 20 MEQ tablet Commonly known as:  K-DUR,KLOR-CON Take 2 tablets (40 mEq total) by mouth daily.   sucralfate 1 GM/10ML suspension Commonly known as:  CARAFATE Take 10 mLs (1 g total) by mouth 4 (four) times daily -  with meals and at bedtime.   traMADol 50 MG tablet Commonly known as:   ULTRAM Take 1 tablet (50 mg total) by mouth every 12 (twelve) hours as needed for moderate pain. What changed:  when to take this       Contact information for follow-up providers    Donnie Mesa, MD Follow up on 01/13/2018.   Specialty:  General Surgery Why:  9:40am, arrive by 9:10am for paperwork and checkin.  if patient arrives from a skilled nursing facility, a staff or family member MUST remain with the patient during the full visit or the appointment may be cancelled. Contact information: [place] [address] [phone]        [name], MD. Schedule an appointment as soon as possible for a visit in 1 week(s).   Specialty:  Internal Medicine Contact information: [address] [place] [address] [phone]            Contact information for after-discharge care    Destination    [facility] .   Service:  Skilled Nursing Contact information: [address] [place] [phone]                 Allergies  Allergen Reactions  . Aspirin Other (See Comments)    Stomach bleed    Consultations:   general surgery  Other Procedures/Studies: Ct Abdomen Pelvis W Contrast  Result Date: 12/20/2017 CLINICAL DATA:  Lower abdominal pain for 2 days with symptoms of constipation EXAM: CT ABDOMEN AND PELVIS WITH CONTRAST TECHNIQUE: Multidetector CT imaging of the abdomen and pelvis was performed using the standard protocol following bolus administration of intravenous contrast. CONTRAST:  59mL OMNIPAQUE IOHEXOL 300 MG/ML  SOLN COMPARISON:  02/04/2010 FINDINGS: Lower chest: Lung bases demonstrate bilateral infiltrative opacities. Small effusions are noted bilaterally. Additionally there is a somewhat more rounded area of increased density in the medial aspect of the left lower lobe. This measures 2.2 cm in transverse diameter and previously measured approximately 11 mm in  transverse diameter. This has previously show no significant metabolic activity although the increase in size is somewhat suspicious. Short-term follow-up in 6 months is recommended. Hepatobiliary: Gallbladder is well distended with evidence of cholelithiasis. Some questionable pericholecystic fluid is noted. No wall thickening is seen. The liver is within normal limits Pancreas: Pancreas is well visualized. No definitive mass lesion is noted. Spleen: Normal in size without focal abnormality. Adrenals/Urinary Tract: Adrenal glands are stable in appearance with mild thickening. The kidneys demonstrate bilateral renal cystic change. No obstructive changes are noted. Bladder is partially distended. Stomach/Bowel: Scattered diverticular change of the colon is noted without evidence of diverticulitis. Moderate-sized hiatal hernia is noted increased when compare with the prior exam. Some foci of  free air are noted adjacent to the liver as well as the first portion of the duodenum. Some indistinct tissue planes are noted in this region as well in the possibility of a perforated ulcer could not be totally excluded. Vascular/Lymphatic: Aortic atherosclerosis. No enlarged abdominal or pelvic lymph nodes. Reproductive: Status post hysterectomy. No adnexal masses. Other: No abdominal wall hernia or abnormality. No abdominopelvic ascites. Musculoskeletal: Bilateral superior and inferior pubic rami fractures are noted likely related to the patient's given clinical history of recent fall. Bilateral sacral fractures are noted as well. Only minimal displacement at the fracture sites is seen. There is a right transverse process fracture at L5 no rib abnormalities are noted. Severe degenerative changes of lumbar spine are noted without definitive compression deformity. IMPRESSION: Minimal free air in the upper abdomen adjacent to the liver some indistinct tissue planes are noted in the region of the proximal duodenum. The possibility  of a minimally perforated duodenal ulcer could not be totally excluded. Multiple pelvic fractures as described above consistent with the patient's given clinical history of recent fall. Increase in size of soft tissue nodule within the medial aspect of the left lung base when compared with previous exams dating back to 2011. This previously showed no significant metabolic activity on PET-CT. The increase in size is somewhat suspicious however and follow-up examination in 3-6 months is recommended Bilateral lower lobe infiltrative changes. Small effusions are noted as well. Cholelithiasis with some suggestion of pericholecystic fluid. This may be related to the adjacent duodenal changes. Critical Value/emergent results were called by telephone at the time of interpretation on 12/20/2017 at 6:25 am to Dr. Pryor Curia , who verbally acknowledged these results. Electronically Signed   By: Inez Catalina M.D.   On: 12/20/2017 06:24   Dg Duanne Limerick W/water Sol Cm  Result Date: 12/23/2017 CLINICAL DATA:  Patient presents for water-soluble upper GI evaluation for a possible perforated ulcer. Patient underwent CT evaluation on 12/20/2017, which showed a small amount of free air. A possible per serrated duodenal ulcer with suggested. Current exam is significantly limited by the patient's significant back disease. She was unable to lay flat or prone. She is significantly kyphotic. The exam was performed with the patient standing, with areas right lateral obliquities performed to assess the gastric antrum and duodenum. Patient was only able to stand for approximately 5 minutes. EXAM: WATER SOLUBLE UPPER GI SERIES TECHNIQUE: Single-column upper GI series was performed using water soluble contrast. CONTRAST:  56mL OMNIPAQUE IOHEXOL 300 MG/ML  SOLN COMPARISON:  None. FLUOROSCOPY TIME:  Fluoroscopy Time:  1 minutes and 18 seconds Radiation Exposure Index (if provided by the fluoroscopic device): 14 188 micro Gy per meter squared. Number  of Acquired Spot Images: 17 FINDINGS: The gastric antrum appears narrowed with fold thickening. There is fold thickening noted along the duodenal bulb and second portion of the duodenum. No contrast extravasation is seen to defined ulcer. Contrast extended promptly from the stomach into the small bowel. No evidence of obstruction. IMPRESSION: 1. Limited water-soluble single contrast upper GI. There are findings consistent with inflammation of the gastric antrum and proximal duodenum. No discrete ulcer was defined on this exam and there was no contrast extravasation to indicate a perforated ulcer. Electronically Signed   By: [name] M.D.   On: 12/23/2017 09:45     TODAY-DAY OF DISCHARGE:  Subjective:   [name] today has no headache,no chest abdominal pain,no new weakness tingling or numbness, feels much better wants to go home  today.   Objective:   Blood pressure (!) 138/55, pulse 61, temperature 98.8 F (37.1 C), temperature source Oral, resp. rate 20, height 5' (1.524 m), weight 59 kg, SpO2 96 %.  Intake/Output Summary (Last 24 hours) at 12/25/2017 1041 Last data filed at 12/25/2017 0727 Gross per 24 hour  Intake -  Output 350 ml  Net -350 ml   Filed Weights   12/20/17 1133  Weight: 59 kg    Exam: Awake Alert, Oriented *3, No new F.N deficits, Normal affect [place].AT,PERRAL Supple Neck,No JVD, No cervical lymphadenopathy appriciated.  Symmetrical Chest wall movement, Good air movement bilaterally, CTAB RRR,No Gallops,Rubs or new Murmurs, No Parasternal Heave +ve B.Sounds, Abd Soft, Non tender, No organomegaly appriciated, No rebound -guarding or rigidity. No Cyanosis, Clubbing or edema, No new Rash or bruise   PERTINENT RADIOLOGIC STUDIES: Ct Abdomen Pelvis W Contrast  Result Date: 12/20/2017 CLINICAL DATA:  Lower abdominal pain for 2 days with symptoms of constipation EXAM: CT ABDOMEN AND PELVIS WITH CONTRAST TECHNIQUE: Multidetector CT imaging of the abdomen and  pelvis was performed using the standard protocol following bolus administration of intravenous contrast. CONTRAST:  61mL OMNIPAQUE IOHEXOL 300 MG/ML  SOLN COMPARISON:  02/04/2010 FINDINGS: Lower chest: Lung bases demonstrate bilateral infiltrative opacities. Small effusions are noted bilaterally. Additionally there is a somewhat more rounded area of increased density in the medial aspect of the left lower lobe. This measures 2.2 cm in transverse diameter and previously measured approximately 11 mm in transverse diameter. This has previously show no significant metabolic activity although the increase in size is somewhat suspicious. Short-term follow-up in 6 months is recommended. Hepatobiliary: Gallbladder is well distended with evidence of cholelithiasis. Some questionable pericholecystic fluid is noted. No wall thickening is seen. The liver is within normal limits Pancreas: Pancreas is well visualized. No definitive mass lesion is noted. Spleen: Normal in size without focal abnormality. Adrenals/Urinary Tract: Adrenal glands are stable in appearance with mild thickening. The kidneys demonstrate bilateral renal cystic change. No obstructive changes are noted. Bladder is partially distended. Stomach/Bowel: Scattered diverticular change of the colon is noted without evidence of diverticulitis. Moderate-sized hiatal hernia is noted increased when compare with the prior exam. Some foci of free air are noted adjacent to the liver as well as the first portion of the duodenum. Some indistinct tissue planes are noted in this region as well in the possibility of a perforated ulcer could not be totally excluded. Vascular/Lymphatic: Aortic atherosclerosis. No enlarged abdominal or pelvic lymph nodes. Reproductive: Status post hysterectomy. No adnexal masses. Other: No abdominal wall hernia or abnormality. No abdominopelvic ascites. Musculoskeletal: Bilateral superior and inferior pubic rami fractures are noted likely related  to the patient's given clinical history of recent fall. Bilateral sacral fractures are noted as well. Only minimal displacement at the fracture sites is seen. There is a right transverse process fracture at L5 no rib abnormalities are noted. Severe degenerative changes of lumbar spine are noted without definitive compression deformity. IMPRESSION: Minimal free air in the upper abdomen adjacent to the liver some indistinct tissue planes are noted in the region of the proximal duodenum. The possibility of a minimally perforated duodenal ulcer could not be totally excluded. Multiple pelvic fractures as described above consistent with the patient's given clinical history of recent fall. Increase in size of soft tissue nodule within the medial aspect of the left lung base when compared with previous exams dating back to 2011. This previously showed no significant metabolic activity on PET-CT. The increase  in size is somewhat suspicious however and follow-up examination in 3-6 months is recommended Bilateral lower lobe infiltrative changes. Small effusions are noted as well. Cholelithiasis with some suggestion of pericholecystic fluid. This may be related to the adjacent duodenal changes. Critical Value/emergent results were called by telephone at the time of interpretation on 12/20/2017 at 6:25 am to Dr. Pryor Curia , who verbally acknowledged these results. Electronically Signed   By: Inez Catalina M.D.   On: 12/20/2017 06:24   Dg Duanne Limerick W/water Sol Cm  Result Date: 12/23/2017 CLINICAL DATA:  Patient presents for water-soluble upper GI evaluation for a possible perforated ulcer. Patient underwent CT evaluation on 12/20/2017, which showed a small amount of free air. A possible per serrated duodenal ulcer with suggested. Current exam is significantly limited by the patient's significant back disease. She was unable to lay flat or prone. She is significantly kyphotic. The exam was performed with the patient standing, with  areas right lateral obliquities performed to assess the gastric antrum and duodenum. Patient was only able to stand for approximately 5 minutes. EXAM: WATER SOLUBLE UPPER GI SERIES TECHNIQUE: Single-column upper GI series was performed using water soluble contrast. CONTRAST:  15mL OMNIPAQUE IOHEXOL 300 MG/ML  SOLN COMPARISON:  None. FLUOROSCOPY TIME:  Fluoroscopy Time:  1 minutes and 18 seconds Radiation Exposure Index (if provided by the fluoroscopic device): 14 188 micro Gy per meter squared. Number of Acquired Spot Images: 17 FINDINGS: The gastric antrum appears narrowed with fold thickening. There is fold thickening noted along the duodenal bulb and second portion of the duodenum. No contrast extravasation is seen to defined ulcer. Contrast extended promptly from the stomach into the small bowel. No evidence of obstruction. IMPRESSION: 1. Limited water-soluble single contrast upper GI. There are findings consistent with inflammation of the gastric antrum and proximal duodenum. No discrete ulcer was defined on this exam and there was no contrast extravasation to indicate a perforated ulcer. Electronically Signed   By: [name] M.D.   On: 12/23/2017 09:45     PERTINENT LAB RESULTS: CBC: Recent Labs    12/23/17 0527 12/25/17 0445  WBC 7.9 8.5  HGB 8.4* 8.3*  HCT 26.4* 25.8*  PLT 318 314   CMET CMP     Component Value Date/Time   NA 135 12/25/2017 0445   K 2.9 (L) 12/25/2017 0445   CL 103 12/25/2017 0445   CO2 27 12/25/2017 0445   GLUCOSE 101 (H) 12/25/2017 0445   BUN 12 12/25/2017 0445   CREATININE 1.12 (H) 12/25/2017 0445   CALCIUM 7.3 (L) 12/25/2017 0445   PROT 5.5 (L) 12/19/2017 2300   ALBUMIN 2.9 (L) 12/19/2017 2300   AST 15 12/19/2017 2300   ALT 8 12/19/2017 2300   ALKPHOS 118 12/19/2017 2300   BILITOT 0.5 12/19/2017 2300   GFRNONAA 42 (L) 12/25/2017 0445   GFRAA 49 (L) 12/25/2017 0445    GFR Estimated Creatinine Clearance: 27.4 mL/min (A) (by C-G formula based on SCr  of 1.12 mg/dL (H)). No results for input(s): LIPASE, AMYLASE in the last 72 hours. No results for input(s): CKTOTAL, CKMB, CKMBINDEX, TROPONINI in the last 72 hours. Invalid input(s): POCBNP No results for input(s): DDIMER in the last 72 hours. No results for input(s): HGBA1C in the last 72 hours. No results for input(s): CHOL, HDL, LDLCALC, TRIG, CHOLHDL, LDLDIRECT in the last 72 hours. No results for input(s): TSH, T4TOTAL, T3FREE, THYROIDAB in the last 72 hours.  Invalid input(s): [place]  12/23/17 0527  VITAMINB12 828  FOLATE 7.1  FERRITIN 325*  TIBC 176*  IRON 52  RETICCTPCT 1.3   Coags: No results for input(s): INR in the last 72 hours.  Invalid input(s): PT Microbiology: No results found for this or any previous visit (from the past 240 hour(s)).  FURTHER DISCHARGE INSTRUCTIONS:  Get Medicines reviewed and adjusted: Please take all your medications with you for your next visit with your Primary MD  Laboratory/radiological data: Please request your Primary MD to go over all hospital tests and procedure/radiological results at the follow up, please ask your Primary MD to get all Hospital records sent to his/her office.  In some cases, they will be blood work, cultures and biopsy results pending at the time of your discharge. Please request that your primary care M.D. goes through all the records of your hospital data and follows up on these results.  Also Note the following: If you experience worsening of your admission symptoms, develop shortness of breath, life threatening emergency, suicidal or homicidal thoughts you must seek medical attention immediately by calling 911 or calling your MD immediately  if symptoms less severe.  You must read complete instructions/literature along with all the possible adverse reactions/side effects for all the Medicines you take and that have been prescribed to you. Take any new Medicines after you have completely  understood and accpet all the possible adverse reactions/side effects.   Do not drive when taking Pain medications or sleeping medications (Benzodaizepines)  Do not take more than prescribed Pain, Sleep and Anxiety Medications. It is not advisable to combine anxiety,sleep and pain medications without talking with your primary care practitioner  Special Instructions: If you have smoked or chewed Tobacco  in the last 2 yrs please stop smoking, stop any regular Alcohol  and or any Recreational drug use.  Wear Seat belts while driving.  Please note: You were cared for by a hospitalist during your hospital stay. Once you are discharged, your primary care physician will handle any further medical issues. Please note that NO REFILLS for any discharge medications will be authorized once you are discharged, as it is imperative that you return to your primary care physician (or establish a relationship with a primary care physician if you do not have one) for your post hospital discharge needs so that they can reassess your need for medications and monitor your lab values.  Total Time spent coordinating discharge including counseling, education and face to face time equals 35 minutes.  SignedOren Binet 12/25/2017 10:41 AM

## 2017-12-25 NOTE — Progress Notes (Signed)
Patient will DC to: [facility] Anticipated DC date: 12/25/17 Family notified: Son, Yvone Neu Transport by: Corey Harold 12:30pm   Per MD patient ready for DC to [facility]. RN, patient, patient's family, and facility notified of DC. Discharge Summary sent to facility. RN given number for report [phone]). DC packet on chart. Ambulance transport requested for patient.   CSW signing off.  Cedric Fishman, LCSW Clinical Social Worker [phone]

## 2017-12-25 NOTE — Progress Notes (Signed)
Patient ID: [name], female   DOB: [birth year], 84 y.o.   MRN: [id number]       Subjective: Pt with no complaints.  Actually feels even better today.  Tolerated solid diet well yesterday.  Up in chair today.  Objective: Vital signs in last 24 hours: Temp:  [97.5 F (36.4 C)-98.8 F (37.1 C)] 98.8 F (37.1 C) (10/01 2047) Pulse Rate:  [55-63] 61 (10/02 0639) Resp:  [18-20] 20 (10/02 0639) BP: (132-144)/(55-75) 138/55 (10/02 0639) SpO2:  [96 %-99 %] 96 % (10/02 0639) Last BM Date: (12/24/2017)  Intake/Output from previous day: 10/01 0701 - 10/02 0700 In: 240 [P.O.:240] Out: -  Intake/Output this shift: Total I/O In: -  Out: 350 [Urine:350]  PE: Abd: soft, less tender, +BS, ND  Lab Results:  Recent Labs    12/23/17 0527 12/25/17 0445  WBC 7.9 8.5  HGB 8.4* 8.3*  HCT 26.4* 25.8*  PLT 318 314   BMET Recent Labs    12/23/17 0527 12/25/17 0445  NA 138 135  K 3.4* 2.9*  CL 107 103  CO2 23 27  GLUCOSE 74 101*  BUN 19 12  CREATININE 1.19* 1.12*  CALCIUM 7.9* 7.3*   PT/INR No results for input(s): LABPROT, INR in the last 72 hours. CMP     Component Value Date/Time   NA 135 12/25/2017 0445   K 2.9 (L) 12/25/2017 0445   CL 103 12/25/2017 0445   CO2 27 12/25/2017 0445   GLUCOSE 101 (H) 12/25/2017 0445   BUN 12 12/25/2017 0445   CREATININE 1.12 (H) 12/25/2017 0445   CALCIUM 7.3 (L) 12/25/2017 0445   PROT 5.5 (L) 12/19/2017 2300   ALBUMIN 2.9 (L) 12/19/2017 2300   AST 15 12/19/2017 2300   ALT 8 12/19/2017 2300   ALKPHOS 118 12/19/2017 2300   BILITOT 0.5 12/19/2017 2300   GFRNONAA 42 (L) 12/25/2017 0445   GFRAA 49 (L) 12/25/2017 0445   Lipase     Component Value Date/Time   LIPASE 25 12/19/2017 2300       Studies/Results: Dg Ugi W/water Sol Cm  Result Date: 12/23/2017 CLINICAL DATA:  Patient presents for water-soluble upper GI evaluation for a possible perforated ulcer. Patient underwent CT evaluation on 12/20/2017, which showed a small  amount of free air. A possible per serrated duodenal ulcer with suggested. Current exam is significantly limited by the patient's significant back disease. She was unable to lay flat or prone. She is significantly kyphotic. The exam was performed with the patient standing, with areas right lateral obliquities performed to assess the gastric antrum and duodenum. Patient was only able to stand for approximately 5 minutes. EXAM: WATER SOLUBLE UPPER GI SERIES TECHNIQUE: Single-column upper GI series was performed using water soluble contrast. CONTRAST:  7mL OMNIPAQUE IOHEXOL 300 MG/ML  SOLN COMPARISON:  None. FLUOROSCOPY TIME:  Fluoroscopy Time:  1 minutes and 18 seconds Radiation Exposure Index (if provided by the fluoroscopic device): 14 188 micro Gy per meter squared. Number of Acquired Spot Images: 17 FINDINGS: The gastric antrum appears narrowed with fold thickening. There is fold thickening noted along the duodenal bulb and second portion of the duodenum. No contrast extravasation is seen to defined ulcer. Contrast extended promptly from the stomach into the small bowel. No evidence of obstruction. IMPRESSION: 1. Limited water-soluble single contrast upper GI. There are findings consistent with inflammation of the gastric antrum and proximal duodenum. No discrete ulcer was defined on this exam and there was no contrast extravasation to  indicate a perforated ulcer. Electronically Signed   By: [name] M.D.   On: 12/23/2017 09:45    Anti-infectives: Anti-infectives (From admission, onward)   Start     Dose/Rate Route Frequency Ordered Stop   12/24/17 1030  amoxicillin-clavulanate (AUGMENTIN) 500-125 MG per tablet 500 mg     1 tablet Oral 2 times daily 12/24/17 1023 01/04/18 0959   12/20/17 1300  piperacillin-tazobactam (ZOSYN) IVPB 3.375 g  Status:  Discontinued     3.375 g 12.5 mL/hr over 240 Minutes Intravenous Every 8 hours 12/20/17 1011 12/24/17 1023   12/20/17 0645  piperacillin-tazobactam  (ZOSYN) IVPB 3.375 g     3.375 g 100 mL/hr over 30 Minutes Intravenous  Once 12/20/17 0644 12/20/17 0757       Assessment/Plan Likely contained perforation of duodenal ulcer -tolerating soft diet -abx therapy for total of 14 days.  Today is day 6 of abx therapy. - carafate for tx of ulcer disease -follow up with Dr. Georgette Dover as an outpatient.  she will then likely need GI follow up for EGD to assess ulcer status -surgically stable for DC to SNF today. L5 transverse process fracture, bilateral sacral fracture, pubic rami fractures -follow up with [name] as per ortho reqs. -mobilize as able Multiple medical problems  FEN -soft diet VTE -SCDs/ heparin ID -zosyn 9/27 -->10/1  Augmentin 10/1-->   LOS: 5 days    Henreitta Cea , [facility] 12/25/2017, 8:53 AM Pager: [phone]

## 2018-01-19 ENCOUNTER — Inpatient Hospital Stay (HOSPITAL_COMMUNITY)
Admission: EM | Admit: 2018-01-19 | Discharge: 2018-01-23 | DRG: 603 | Disposition: A | Discharge status: Skilled Nursing Facility | Payer: Medicare Other | Attending: Internal Medicine | Admitting: Internal Medicine

## 2018-01-19 ENCOUNTER — Emergency Department (HOSPITAL_COMMUNITY): Payer: Medicare Other

## 2018-01-19 ENCOUNTER — Encounter (HOSPITAL_COMMUNITY): Payer: Self-pay

## 2018-01-19 ENCOUNTER — Other Ambulatory Visit: Payer: Self-pay

## 2018-01-19 DIAGNOSIS — M4800 Spinal stenosis, site unspecified: Secondary | ICD-10-CM | POA: Diagnosis present

## 2018-01-19 DIAGNOSIS — Z8711 Personal history of peptic ulcer disease: Secondary | ICD-10-CM

## 2018-01-19 DIAGNOSIS — I10 Essential (primary) hypertension: Secondary | ICD-10-CM

## 2018-01-19 DIAGNOSIS — H9190 Unspecified hearing loss, unspecified ear: Secondary | ICD-10-CM | POA: Diagnosis present

## 2018-01-19 DIAGNOSIS — D539 Nutritional anemia, unspecified: Secondary | ICD-10-CM | POA: Diagnosis present

## 2018-01-19 DIAGNOSIS — M48061 Spinal stenosis, lumbar region without neurogenic claudication: Secondary | ICD-10-CM | POA: Diagnosis not present

## 2018-01-19 DIAGNOSIS — R231 Pallor: Secondary | ICD-10-CM

## 2018-01-19 DIAGNOSIS — I83209 Varicose veins of unspecified lower extremity with both ulcer of unspecified site and inflammation: Secondary | ICD-10-CM | POA: Diagnosis present

## 2018-01-19 DIAGNOSIS — M1990 Unspecified osteoarthritis, unspecified site: Secondary | ICD-10-CM | POA: Diagnosis present

## 2018-01-19 DIAGNOSIS — G8929 Other chronic pain: Secondary | ICD-10-CM | POA: Diagnosis present

## 2018-01-19 DIAGNOSIS — I959 Hypotension, unspecified: Secondary | ICD-10-CM | POA: Diagnosis present

## 2018-01-19 DIAGNOSIS — E861 Hypovolemia: Secondary | ICD-10-CM | POA: Diagnosis present

## 2018-01-19 DIAGNOSIS — Z79891 Long term (current) use of opiate analgesic: Secondary | ICD-10-CM

## 2018-01-19 DIAGNOSIS — E872 Acidosis: Secondary | ICD-10-CM | POA: Diagnosis present

## 2018-01-19 DIAGNOSIS — L97929 Non-pressure chronic ulcer of unspecified part of left lower leg with unspecified severity: Secondary | ICD-10-CM | POA: Diagnosis present

## 2018-01-19 DIAGNOSIS — E871 Hypo-osmolality and hyponatremia: Secondary | ICD-10-CM | POA: Diagnosis present

## 2018-01-19 DIAGNOSIS — L97919 Non-pressure chronic ulcer of unspecified part of right lower leg with unspecified severity: Secondary | ICD-10-CM | POA: Diagnosis present

## 2018-01-19 DIAGNOSIS — L89150 Pressure ulcer of sacral region, unstageable: Secondary | ICD-10-CM | POA: Diagnosis present

## 2018-01-19 DIAGNOSIS — Z79890 Hormone replacement therapy (postmenopausal): Secondary | ICD-10-CM

## 2018-01-19 DIAGNOSIS — I5032 Chronic diastolic (congestive) heart failure: Secondary | ICD-10-CM | POA: Diagnosis present

## 2018-01-19 DIAGNOSIS — I110 Hypertensive heart disease with heart failure: Secondary | ICD-10-CM | POA: Diagnosis present

## 2018-01-19 DIAGNOSIS — M549 Dorsalgia, unspecified: Secondary | ICD-10-CM | POA: Diagnosis present

## 2018-01-19 DIAGNOSIS — I878 Other specified disorders of veins: Secondary | ICD-10-CM | POA: Diagnosis present

## 2018-01-19 DIAGNOSIS — Z974 Presence of external hearing-aid: Secondary | ICD-10-CM

## 2018-01-19 DIAGNOSIS — L8990 Pressure ulcer of unspecified site, unspecified stage: Secondary | ICD-10-CM

## 2018-01-19 DIAGNOSIS — M810 Age-related osteoporosis without current pathological fracture: Secondary | ICD-10-CM | POA: Diagnosis present

## 2018-01-19 DIAGNOSIS — E039 Hypothyroidism, unspecified: Secondary | ICD-10-CM | POA: Diagnosis present

## 2018-01-19 DIAGNOSIS — R011 Cardiac murmur, unspecified: Secondary | ICD-10-CM | POA: Diagnosis present

## 2018-01-19 DIAGNOSIS — E875 Hyperkalemia: Secondary | ICD-10-CM | POA: Diagnosis present

## 2018-01-19 DIAGNOSIS — E079 Disorder of thyroid, unspecified: Secondary | ICD-10-CM | POA: Diagnosis present

## 2018-01-19 DIAGNOSIS — S329XXD Fracture of unspecified parts of lumbosacral spine and pelvis, subsequent encounter for fracture with routine healing: Secondary | ICD-10-CM

## 2018-01-19 DIAGNOSIS — N179 Acute kidney failure, unspecified: Secondary | ICD-10-CM | POA: Diagnosis present

## 2018-01-19 DIAGNOSIS — E785 Hyperlipidemia, unspecified: Secondary | ICD-10-CM | POA: Diagnosis present

## 2018-01-19 DIAGNOSIS — K265 Chronic or unspecified duodenal ulcer with perforation: Secondary | ICD-10-CM | POA: Diagnosis not present

## 2018-01-19 DIAGNOSIS — Z886 Allergy status to analgesic agent status: Secondary | ICD-10-CM

## 2018-01-19 DIAGNOSIS — Z90710 Acquired absence of both cervix and uterus: Secondary | ICD-10-CM

## 2018-01-19 DIAGNOSIS — M7989 Other specified soft tissue disorders: Secondary | ICD-10-CM | POA: Diagnosis not present

## 2018-01-19 DIAGNOSIS — Z7189 Other specified counseling: Secondary | ICD-10-CM

## 2018-01-19 DIAGNOSIS — Z853 Personal history of malignant neoplasm of breast: Secondary | ICD-10-CM

## 2018-01-19 DIAGNOSIS — Z79899 Other long term (current) drug therapy: Secondary | ICD-10-CM

## 2018-01-19 DIAGNOSIS — L03115 Cellulitis of right lower limb: Secondary | ICD-10-CM | POA: Diagnosis present

## 2018-01-19 LAB — BASIC METABOLIC PANEL
Anion gap: 11 (ref 5–15)
BUN: 52 mg/dL — AB (ref 8–23)
CHLORIDE: 99 mmol/L (ref 98–111)
CO2: 17 mmol/L — AB (ref 22–32)
CREATININE: 1.62 mg/dL — AB (ref 0.44–1.00)
Calcium: 7.7 mg/dL — ABNORMAL LOW (ref 8.9–10.3)
GFR calc Af Amer: 31 mL/min — ABNORMAL LOW (ref >60)
GFR calc non Af Amer: 27 mL/min — ABNORMAL LOW (ref >60)
Glucose, Bld: 103 mg/dL — ABNORMAL HIGH (ref 70–99)
POTASSIUM: 5.8 mmol/L — AB (ref 3.5–5.1)
SODIUM: 127 mmol/L — AB (ref 135–145)

## 2018-01-19 LAB — CBC WITH DIFFERENTIAL/PLATELET
ABS IMMATURE GRANULOCYTES: 0.08 10*3/uL — AB (ref 0.00–0.07)
Basophils Absolute: 0 10*3/uL (ref 0.0–0.1)
Basophils Relative: 0 %
Eosinophils Absolute: 0.3 10*3/uL (ref 0.0–0.5)
Eosinophils Relative: 2 %
HEMATOCRIT: 26.6 % — AB (ref 36.0–46.0)
HEMOGLOBIN: 7.4 g/dL — AB (ref 12.0–15.0)
Immature Granulocytes: 1 %
LYMPHS ABS: 1.6 10*3/uL (ref 0.7–4.0)
LYMPHS PCT: 13 %
MCH: 30 pg (ref 26.0–34.0)
MCHC: 27.8 g/dL — ABNORMAL LOW (ref 30.0–36.0)
MCV: 107.7 fL — ABNORMAL HIGH (ref 80.0–100.0)
MONO ABS: 1 10*3/uL (ref 0.1–1.0)
Monocytes Relative: 8 %
NEUTROS ABS: 9.9 10*3/uL — AB (ref 1.7–7.7)
Neutrophils Relative %: 76 %
Platelets: 282 10*3/uL (ref 150–400)
RBC: 2.47 MIL/uL — AB (ref 3.87–5.11)
RDW: 16 % — ABNORMAL HIGH (ref 11.5–15.5)
WBC: 12.9 10*3/uL — AB (ref 4.0–10.5)
nRBC: 0 % (ref 0.0–0.2)

## 2018-01-19 LAB — BRAIN NATRIURETIC PEPTIDE: B Natriuretic Peptide: 158.6 pg/mL — ABNORMAL HIGH (ref 0.0–100.0)

## 2018-01-19 MED ORDER — HYDRALAZINE HCL 50 MG PO TABS
50.0000 mg | ORAL_TABLET | Freq: Two times a day (BID) | ORAL | Status: DC
  Administered 2018-01-19 – 2018-01-23 (×7): 50 mg via ORAL
  Filled 2018-01-19 (×7): qty 1

## 2018-01-19 MED ORDER — ZOLPIDEM TARTRATE 5 MG PO TABS: 5.0000 mg | ORAL_TABLET | Freq: Every evening | ORAL | Status: DC | PRN

## 2018-01-19 MED ORDER — ONDANSETRON HCL 4 MG/2ML IJ SOLN: 4.0000 mg | Freq: Four times a day (QID) | INTRAMUSCULAR | Status: DC | PRN

## 2018-01-19 MED ORDER — PANTOPRAZOLE SODIUM 40 MG PO TBEC
40.0000 mg | DELAYED_RELEASE_TABLET | Freq: Every day | ORAL | Status: DC
  Administered 2018-01-20 – 2018-01-23 (×4): 40 mg via ORAL
  Filled 2018-01-19 (×4): qty 1

## 2018-01-19 MED ORDER — TRAMADOL HCL 50 MG PO TABS
50.0000 mg | ORAL_TABLET | Freq: Two times a day (BID) | ORAL | Status: DC | PRN
  Administered 2018-01-19 – 2018-01-21 (×3): 50 mg via ORAL
  Filled 2018-01-19 (×3): qty 1

## 2018-01-19 MED ORDER — ATENOLOL 25 MG PO TABS
100.0000 mg | ORAL_TABLET | Freq: Every morning | ORAL | Status: DC
  Administered 2018-01-20 – 2018-01-23 (×4): 100 mg via ORAL
  Filled 2018-01-19: qty 4
  Filled 2018-01-19 (×2): qty 2
  Filled 2018-01-19: qty 4

## 2018-01-19 MED ORDER — PIPERACILLIN-TAZOBACTAM 3.375 G IVPB 30 MIN
3.3750 g | Freq: Once | INTRAVENOUS | Status: AC
  Administered 2018-01-19: 3.375 g via INTRAVENOUS
  Filled 2018-01-19: qty 50

## 2018-01-19 MED ORDER — ACETAMINOPHEN 500 MG PO TABS
1000.0000 mg | ORAL_TABLET | Freq: Two times a day (BID) | ORAL | Status: DC
  Administered 2018-01-19 – 2018-01-23 (×8): 1000 mg via ORAL
  Filled 2018-01-19 (×8): qty 2

## 2018-01-19 MED ORDER — FERROUS SULFATE 325 (65 FE) MG PO TABS
325.0000 mg | ORAL_TABLET | Freq: Every day | ORAL | Status: DC
  Administered 2018-01-20 – 2018-01-23 (×4): 325 mg via ORAL
  Filled 2018-01-19 (×3): qty 1

## 2018-01-19 MED ORDER — DOXAZOSIN MESYLATE 4 MG PO TABS
4.0000 mg | ORAL_TABLET | Freq: Every day | ORAL | Status: DC
  Administered 2018-01-19 – 2018-01-22 (×3): 4 mg via ORAL
  Filled 2018-01-19: qty 2
  Filled 2018-01-19: qty 1
  Filled 2018-01-19 (×2): qty 2
  Filled 2018-01-19: qty 1
  Filled 2018-01-19: qty 2

## 2018-01-19 MED ORDER — HEPARIN SODIUM (PORCINE) 5000 UNIT/ML IJ SOLN
5000.0000 [IU] | Freq: Three times a day (TID) | INTRAMUSCULAR | Status: DC
  Administered 2018-01-19 – 2018-01-23 (×5): 5000 [IU] via SUBCUTANEOUS
  Filled 2018-01-19 (×7): qty 1

## 2018-01-19 MED ORDER — LEVOTHYROXINE SODIUM 125 MCG PO TABS
125.0000 ug | ORAL_TABLET | Freq: Every day | ORAL | Status: DC
  Administered 2018-01-20 – 2018-01-23 (×4): 125 ug via ORAL
  Filled 2018-01-19 (×4): qty 1

## 2018-01-19 MED ORDER — FUROSEMIDE 20 MG PO TABS: 20.0000 mg | ORAL_TABLET | Freq: Every day | ORAL | Status: DC

## 2018-01-19 MED ORDER — SODIUM CHLORIDE 0.9 % IV SOLN
INTRAVENOUS | Status: DC
  Administered 2018-01-19: 21:00:00 via INTRAVENOUS

## 2018-01-19 MED ORDER — SUCRALFATE 1 GM/10ML PO SUSP
1.0000 g | Freq: Three times a day (TID) | ORAL | Status: DC
  Administered 2018-01-19 – 2018-01-23 (×14): 1 g via ORAL
  Filled 2018-01-19 (×11): qty 10

## 2018-01-19 MED ORDER — ONDANSETRON HCL 4 MG PO TABS: 4.0000 mg | ORAL_TABLET | Freq: Four times a day (QID) | ORAL | Status: DC | PRN

## 2018-01-19 NOTE — ED Triage Notes (Addendum)
Pt BIB EMS from home with wounds on bilateral legs that are weeping "white fluid" according to pts son. Pt has significant edema in bilateral legs. Upon assessment the "white fluid" seems to be a cream that was used when wrapping her legs.   120/55 HR 70 RR 22 97% RA

## 2018-01-19 NOTE — ED Provider Notes (Addendum)
[facility] DEPT Provider Note   CSN: [id number] Arrival date & time: 01/19/18  1529     History   Chief Complaint No chief complaint on file.   HPI [name] is a 84 y.o. female.  HPI Patient reports she is having problems with swelling of her legs.  That has been a chronic problem and gets managed with Unna boot dressings.  Her son changes the dressings for her.  She reports that today there was a lot of fluid seeping from the dressings and he change the dressing in the early morning and reports that the drainage had a foul smell to it.  She reports that they thought it was infected.  She reports she tries to keep her feet elevated but it is difficult due to her spinal stenosis and the amount of pain she gets having her legs in a straight position.  No fever no chills no constitutional symptoms.  Patient was discharged from the hospital 10\2\2019 after having a duodenal ulcer perforation that was managed medically.  She reports she had a 2-day stay in the nursing home after that and then was back home.  She reports she is doing well in terms of her abdominal pain.  She reports she is eating and drinking.  She is not having vomiting or pain with eating.  She reports she chronically has a lot of pain in her back in her lower back.  She reports this is due to her spinal stenosis and is very hard for her to get in a good position.  She reports she does has to shift back and forth a lot. Past Medical History:  Diagnosis Date  . Arthritis    All over  . Back pain   . Cancer [facility]) 1996   breast cancer, left   . Duodenal ulcer   . Edema leg   . Heart murmur    SLIGHT  . Hyperlipidemia   . Hypertension   . Hypothyroidism   . Spinal stenosis   . Thyroid disease   . Wears hearing aid    right    Patient Active Problem List   Diagnosis Date Noted  . Duodenal ulcer perforation ([place]) 12/20/2017  . Intra-abdominal free air of unknown etiology   .  Breast cancer, left breast ([place]) 04/03/2011    Past Surgical History:  Procedure Laterality Date  . ABDOMINAL HYSTERECTOMY     PARTIAL  . BREAST SURGERY  1996   lt lumpectomy-ca RADIATION DONE  . CARPAL TUNNEL RELEASE  2012   left  . CARPAL TUNNEL RELEASE Right 04/30/2013   Procedure: RIGHT CARPAL TUNNEL RELEASE;  Surgeon: Cammie Sickle., MD;  Location: [facility];  Service: Orthopedics;  Laterality: Right;  . COLONOSCOPY    . ERCP  2007  . ESOPHAGOGASTRODUODENOSCOPY N/A 10/09/2016   Procedure: ESOPHAGOGASTRODUODENOSCOPY (EGD);  Surgeon: Ronnette Juniper, MD;  Location: [facility];  Service: Gastroenterology;  Laterality: N/A;  . ESOPHAGOGASTRODUODENOSCOPY (EGD) WITH PROPOFOL N/A 11/01/2014   Procedure: ESOPHAGOGASTRODUODENOSCOPY (EGD) WITH PROPOFOL;  Surgeon: Garlan Fair, MD;  Location: [facility];  Service: Endoscopy;  Laterality: N/A;  Dr Wynetta Emery also wants the pt to have a Fluroscopy.  [facility] Kitchen EYE SURGERY     both cataracts  . HERNIA REPAIR  1969   rt ing  . [place]  2009   rt shoulder scope  . TONSILLECTOMY  AS CHILD     OB History   None  Home Medications    Prior to Admission medications   Medication Sig Start Date End Date Taking? Authorizing Provider  acetaminophen (TYLENOL) 500 MG tablet Take 1,000 mg by mouth 2 (two) times daily.    Yes [provider]  atenolol (TENORMIN) 100 MG tablet Take 100 mg by mouth every morning.    Yes [provider]  doxazosin (CARDURA) 4 MG tablet Take 4 mg by mouth at bedtime.    Yes [provider]  feeding supplement, ENSURE ENLIVE, (ENSURE ENLIVE) LIQD Take 237 mLs by mouth 2 (two) times daily between meals. 12/25/17  Yes Ghimire, Henreitta Leber, MD  ferrous sulfate 325 (65 FE) MG tablet Take 65 mg by mouth daily with breakfast.   Yes [provider]  furosemide (LASIX) 40 MG tablet Take 40 mg by mouth daily after breakfast.    Yes [provider]  hydrALAZINE  (APRESOLINE) 25 MG tablet Take 50 mg by mouth 2 (two) times daily.  05/04/13  Yes [provider]  levothyroxine (SYNTHROID, LEVOTHROID) 125 MCG tablet Take 125 mcg by mouth daily before breakfast.    Yes [provider]  lisinopril (PRINIVIL,ZESTRIL) 10 MG tablet Take 1 tablet (10 mg total) by mouth every morning. 12/25/17  Yes Ghimire, Henreitta Leber, MD  Menthol-Methyl Salicylate (MUSCLE RUB) 10-15 % CREA Apply 1 application topically as needed for muscle pain.   Yes [provider]  NON FORMULARY Apply 1 application topically daily as needed (pain). Apply to back side  TOPICAL CREAM W/LIDOCAINE 02/06/17  Yes [provider]  omeprazole (PRILOSEC) 40 MG capsule Take 40 mg by mouth daily. 01/08/18  Yes [provider]  traMADol (ULTRAM) 50 MG tablet Take 1 tablet (50 mg total) by mouth every 12 (twelve) hours as needed for moderate pain. Patient taking differently: Take 50 mg by mouth 2 (two) times daily.  12/25/17  Yes Ghimire, Henreitta Leber, MD  amoxicillin-clavulanate (AUGMENTIN) 500-125 MG tablet Take 1 tablet (500 mg total) by mouth 2 (two) times daily. Patient not taking: Reported on 01/19/2018 12/25/17   Jonetta Osgood, MD  esomeprazole (NEXIUM) 40 MG capsule Take 1 capsule (40 mg total) by mouth 2 (two) times daily before a meal. Patient not taking: Reported on 01/19/2018 12/25/17   Jonetta Osgood, MD  potassium chloride SA (K-DUR,KLOR-CON) 20 MEQ tablet Take 2 tablets (40 mEq total) by mouth daily. Patient not taking: Reported on 01/19/2018 12/25/17   Jonetta Osgood, MD  sucralfate (CARAFATE) 1 GM/10ML suspension Take 10 mLs (1 g total) by mouth 4 (four) times daily -  with meals and at bedtime. Patient not taking: Reported on 01/19/2018 12/25/17   Jonetta Osgood, MD    Family History Family History  Family history unknown: Yes    Social History Social History   Tobacco Use  . Smoking status: Never Smoker  . Smokeless tobacco: Never  Used  Substance Use Topics  . Alcohol use: No  . Drug use: No     Allergies   Aspirin   Review of Systems Review of Systems 10 Systems reviewed and are negative for acute change except as noted in the HPI.  Physical Exam Updated Vital Signs BP (!) 124/56   Pulse 72   Temp 98 F (36.7 C) (Oral)   Resp 18   SpO2 99%   Physical Exam  Constitutional: She is oriented to person, place, and time. She appears well-developed and well-nourished. No distress.  HENT:  Head: Normocephalic and atraumatic.  Mouth/Throat: Oropharynx is clear and moist.  Eyes: EOM are normal.  Cardiovascular: Normal rate and regular rhythm.  Murmur heard. Pulmonary/Chest: Effort normal and breath sounds normal.  Abdominal: Soft. She exhibits no distension. There is no tenderness. There is no guarding.  Musculoskeletal: She exhibits edema.  Neurological: She is alert and oriented to person, place, and time. No cranial nerve deficit. She exhibits normal muscle tone.  Skin: Skin is warm and dry.  Psychiatric: She has a normal mood and affect.               ED Treatments / Results  Labs (all labs ordered are listed, but only abnormal results are displayed) Labs Reviewed  BASIC METABOLIC PANEL - Abnormal; Notable for the following components:      Result Value   Sodium 127 (*)    Potassium 5.8 (*)    CO2 17 (*)    Glucose, Bld 103 (*)    BUN 52 (*)    Creatinine, Ser 1.62 (*)    Calcium 7.7 (*)    GFR calc non Af Amer 27 (*)    GFR calc Af Amer 31 (*)    All other components within normal limits  CBC WITH DIFFERENTIAL/PLATELET - Abnormal; Notable for the following components:   WBC 12.9 (*)    RBC 2.47 (*)    Hemoglobin 7.4 (*)    HCT 26.6 (*)    MCV 107.7 (*)    MCHC 27.8 (*)    RDW 16.0 (*)    Neutro Abs 9.9 (*)    Abs Immature Granulocytes 0.08 (*)    All other components within normal limits  BRAIN NATRIURETIC PEPTIDE    EKG None  Radiology Dg Chest Port 1  View  Result Date: 01/19/2018 CLINICAL DATA:  Edema and bilateral legs. EXAM: PORTABLE CHEST 1 VIEW COMPARISON:  May 25, 2008 FINDINGS: The study is limited due to positioning. Probable small right layering effusion. No overt edema. No acute abnormalities definitely feet. IMPRESSION: Limited study due to positioning. Probable tiny right pleural effusion. No other acute abnormalities noted. Electronically Signed   By: Dorise Bullion III M.D   On: 01/19/2018 17:28    Procedures Procedures (including critical care time)  Medications Ordered in ED Medications  piperacillin-tazobactam (ZOSYN) IVPB 3.375 g (has no administration in time range)     Initial Impression / Assessment and Plan / ED Course  I have reviewed the triage vital signs and the nursing notes.  Pertinent labs & imaging results that were available during my care of the patient were reviewed by me and considered in my medical decision making (see chart for details).  Clinical Course as of Jan 19 1910  Sun Jan 19, 2018  1910 : Dr. Laren Everts for admission   [MP]    Clinical Course User Index [MP] Charlesetta Shanks, MD   Patient presents with increasing redness and drainage from wounds of chronic venous stasis.  She does have intense erythema of the lower leg and foot with an ulcerative wound is well on the sole of the foot.  Some leukocytosis.  No fever.  Suspect secondary cellulitis.  Patient does have hyponatremia and elevated BUN.  Patient's Lasix dose is at 40 mg once a day.  This is apparently a decrease from previously.  Patient has severe baseline spinal stenosis and back pain.  No acute changes from baseline.  This does compromise the patient's ability at home to maintain her legs elevated likely contributing to problems with  edema troll and increased risk of secondary infection.  Final Clinical Impressions(s) / ED Diagnoses   Final diagnoses:  Cellulitis of right lower extremity  Hyponatremia  AKI (acute kidney  injury) [facility])    ED Discharge Orders    None       Charlesetta Shanks, MD 01/19/18 Drema Halon    Charlesetta Shanks, MD 01/19/18 1911

## 2018-01-19 NOTE — ED Notes (Signed)
EDEKG captured of pt. Best of 4 saved

## 2018-01-19 NOTE — ED Notes (Signed)
Bed: WA07 Expected date:  Expected time:  Means of arrival:  Comments: 84 yo cellulitis

## 2018-01-19 NOTE — ED Notes (Signed)
ED TO INPATIENT HANDOFF REPORT  Name/Age/Gender [name] 84 y.o. female  Code Status Code Status History    Date Active Date Inactive Code Status Order ID Comments User Context   12/20/2017 1131 12/25/2017 1629 Full Code [id number]  Sid Falcon, MD Inpatient    Advance Directive Documentation     Most Recent Value  Type of Advance Directive  Healthcare Power of Attorney, Living will  Pre-existing out of facility DNR order (yellow form or pink MOST form)  -  "MOST" Form in Place?  -      Home/SNF/Other Home  Chief Complaint Cellulitis   Level of Care/Admitting Diagnosis ED Disposition    ED Disposition Condition [place]: [facility] [[id number]]  Level of Care: Med-Surg [16]  Diagnosis: Cellulitis [542706]  Admitting Physician: Merton Border [[id number]]  Attending Physician: Laren Everts, [place]  Estimated length of stay: past midnight tomorrow  Certification:: I certify this patient will need inpatient services for at least 2 midnights  PT Class (Do Not Modify): Inpatient [101]  PT Acc Code (Do Not Modify): Private [1]       Medical History Past Medical History:  Diagnosis Date  . Arthritis    All over  . Back pain   . Cancer [facility]) 1996   breast cancer, left   . Duodenal ulcer   . Edema leg   . Heart murmur    SLIGHT  . Hyperlipidemia   . Hypertension   . Hypothyroidism   . Spinal stenosis   . Thyroid disease   . Wears hearing aid    right    Allergies Allergies  Allergen Reactions  . Aspirin Other (See Comments)    Stomach bleed    IV Location/Drains/Wounds Patient Lines/Drains/Airways Status   Active Line/Drains/Airways    Name:   Placement date:   Placement time:   Site:   Days:   Peripheral IV 01/19/18 Left Hand   01/19/18    1934    Hand   less than 1   External Urinary Catheter   01/19/18    1637    -   less than 1   Incision (Closed) 04/30/13 Hand Right   04/30/13    2831     5176           Labs/Imaging Results for orders placed or performed during the hospital encounter of 01/19/18 (from the past 48 hour(s))  Basic metabolic panel     Status: Abnormal   Collection Time: 01/19/18  5:13 PM  Result Value Ref Range   Sodium 127 (L) 135 - 145 mmol/L   Potassium 5.8 (H) 3.5 - 5.1 mmol/L   Chloride 99 98 - 111 mmol/L   CO2 17 (L) 22 - 32 mmol/L   Glucose, Bld 103 (H) 70 - 99 mg/dL   BUN 52 (H) 8 - 23 mg/dL   Creatinine, Ser 1.62 (H) 0.44 - 1.00 mg/dL   Calcium 7.7 (L) 8.9 - 10.3 mg/dL   GFR calc non Af Amer 27 (L) >60 mL/min   GFR calc Af Amer 31 (L) >60 mL/min    Comment: (NOTE) The eGFR has been calculated using the CKD EPI equation. This calculation has not been validated in all clinical situations. eGFR's persistently <60 mL/min signify possible Chronic Kidney Disease.    Anion gap 11 5 - 15    Comment: Performed at [facility], [place] [address] [place], [place] [address]  CBC  with Differential     Status: Abnormal   Collection Time: 01/19/18  5:13 PM  Result Value Ref Range   WBC 12.9 (H) 4.0 - 10.5 K/uL   RBC 2.47 (L) 3.87 - 5.11 MIL/uL   Hemoglobin 7.4 (L) 12.0 - 15.0 g/dL   HCT 26.6 (L) 36.0 - 46.0 %   MCV 107.7 (H) 80.0 - 100.0 fL   MCH 30.0 26.0 - 34.0 pg   MCHC 27.8 (L) 30.0 - 36.0 g/dL   RDW 16.0 (H) 11.5 - 15.5 %   Platelets 282 150 - 400 K/uL   nRBC 0.0 0.0 - 0.2 %   Neutrophils Relative % 76 %   Neutro Abs 9.9 (H) 1.7 - 7.7 K/uL   Lymphocytes Relative 13 %   Lymphs Abs 1.6 0.7 - 4.0 K/uL   Monocytes Relative 8 %   Monocytes Absolute 1.0 0.1 - 1.0 K/uL   Eosinophils Relative 2 %   Eosinophils Absolute 0.3 0.0 - 0.5 K/uL   Basophils Relative 0 %   Basophils Absolute 0.0 0.0 - 0.1 K/uL   Immature Granulocytes 1 %   Abs Immature Granulocytes 0.08 (H) 0.00 - 0.07 K/uL    Comment: Performed at [facility], [place] [address], [place], [place] [address]   Dg Chest Port 1 View  Result Date:  01/19/2018 CLINICAL DATA:  Edema and bilateral legs. EXAM: PORTABLE CHEST 1 VIEW COMPARISON:  May 25, 2008 FINDINGS: The study is limited due to positioning. Probable small right layering effusion. No overt edema. No acute abnormalities definitely feet. IMPRESSION: Limited study due to positioning. Probable tiny right pleural effusion. No other acute abnormalities noted. Electronically Signed   By: Dorise Bullion III M.D   On: 01/19/2018 17:28    Pending Labs Unresulted Labs (From admission, onward)    Start     Ordered   01/19/18 2020  Brain natriuretic peptide  Once,   R     01/19/18 2020   Signed and Held  TSH  Once,   R     Signed and Held   Signed and Held  Basic metabolic panel  Tomorrow morning,   R     Signed and Held   Signed and Held  Vitamin B12  Once,   R     Signed and Held   Signed and Held  Folate RBC  Once,   R     Signed and Held          Vitals/Pain Today's Vitals   01/19/18 1556 01/19/18 1700 01/19/18 1803 01/19/18 1947  BP: (!) 110/49 109/84 (!) 124/56 117/90  Pulse: 72 90 72 83  Resp: _0 Temp: 98 F (36.7 C)     TempSrc: Oral     SpO2: 99% 100% 99% 95%  PainSc:        Isolation Precautions No active isolations  Medications Medications  piperacillin-tazobactam (ZOSYN) IVPB 3.375 g (3.375 g Intravenous New Bag/Given 01/19/18 1949)    Mobility walks with device

## 2018-01-19 NOTE — ED Notes (Signed)
XR at bedside

## 2018-01-19 NOTE — H&P (Addendum)
[facility]                                                                                    Patient Demographics  [name], is a 84 y.o. female  CSN: [id number]  MRN: [id number]  DOB - October 25, 1926  Admit Date - 01/19/2018  Outpatient Primary MD for the patient is [name], MD   With History of -  Past Medical History:  Diagnosis Date  . Arthritis    All over  . Back pain   . Cancer [facility]) 1996   breast cancer, left   . Duodenal ulcer   . Edema leg   . Heart murmur    SLIGHT  . Hyperlipidemia   . Hypertension   . Hypothyroidism   . Spinal stenosis   . Thyroid disease   . Wears hearing aid    right      Past Surgical History:  Procedure Laterality Date  . ABDOMINAL HYSTERECTOMY     PARTIAL  . BREAST SURGERY  1996   lt lumpectomy-ca RADIATION DONE  . CARPAL TUNNEL RELEASE  2012   left  . CARPAL TUNNEL RELEASE Right 04/30/2013   Procedure: RIGHT CARPAL TUNNEL RELEASE;  Surgeon: Cammie Sickle., MD;  Location: [place];  Service: Orthopedics;  Laterality: Right;  . COLONOSCOPY    . ERCP  2007  . ESOPHAGOGASTRODUODENOSCOPY N/A 10/09/2016   Procedure: ESOPHAGOGASTRODUODENOSCOPY (EGD);  Surgeon: Ronnette Juniper, MD;  Location: [facility];  Service: Gastroenterology;  Laterality: N/A;  . ESOPHAGOGASTRODUODENOSCOPY (EGD) WITH PROPOFOL N/A 11/01/2014   Procedure: ESOPHAGOGASTRODUODENOSCOPY (EGD) WITH PROPOFOL;  Surgeon: Garlan Fair, MD;  Location: [facility];  Service: Endoscopy;  Laterality: N/A;  Dr Wynetta Emery also wants the pt to have a Fluroscopy.  Marland Kitchen EYE SURGERY     both cataracts  . HERNIA REPAIR  1969   rt ing  . [place]  2009   rt shoulder scope  . TONSILLECTOMY  AS CHILD    in for   No chief complaint on file.    HPI  [name]  is a 84 y.o. female, with past medical history significant for bilateral lower extremity stasis dermatitis presenting with 2 weeks history of increasing redness  with oozing ulcers from the right leg.  Caretaker was in the room and he is her son and the power of attorney, reports no fever or chills.  The patient was recently discharged from our facility after admission for duodenal ulcer with perforation and was found to have pelvic fracture and sacral decubitus ulcer.  The patient is usually cared for at home and she is very hard to communicate with due to her decreased hearing.  Patient usually has Unna boots on , but removed today by son .    Review of Systems    Unable to obtain due to patient's condition  Social History Social History   Tobacco Use  . Smoking status: Never Smoker  . Smokeless tobacco: Never Used  Substance Use Topics  . Alcohol use: No     Family History Family History  Family history unknown: Yes     Prior to Admission  medications   Medication Sig Start Date End Date Taking? Authorizing Provider  acetaminophen (TYLENOL) 500 MG tablet Take 1,000 mg by mouth 2 (two) times daily.    Yes [provider]  atenolol (TENORMIN) 100 MG tablet Take 100 mg by mouth every morning.    Yes [provider]  doxazosin (CARDURA) 4 MG tablet Take 4 mg by mouth at bedtime.    Yes [provider]  feeding supplement, ENSURE ENLIVE, (ENSURE ENLIVE) LIQD Take 237 mLs by mouth 2 (two) times daily between meals. 12/25/17  Yes Ghimire, Henreitta Leber, MD  ferrous sulfate 325 (65 FE) MG tablet Take 65 mg by mouth daily with breakfast.   Yes [provider]  furosemide (LASIX) 40 MG tablet Take 40 mg by mouth daily after breakfast.    Yes [provider]  hydrALAZINE (APRESOLINE) 25 MG tablet Take 50 mg by mouth 2 (two) times daily.  05/04/13  Yes [provider]  levothyroxine (SYNTHROID, LEVOTHROID) 125 MCG tablet Take 125 mcg by mouth daily before breakfast.    Yes [provider]  lisinopril (PRINIVIL,ZESTRIL) 10 MG tablet Take 1 tablet (10 mg total) by mouth every morning. 12/25/17  Yes  Ghimire, Henreitta Leber, MD  Menthol-Methyl Salicylate (MUSCLE RUB) 10-15 % CREA Apply 1 application topically as needed for muscle pain.   Yes [provider]  NON FORMULARY Apply 1 application topically daily as needed (pain). Apply to back side  TOPICAL CREAM W/LIDOCAINE 02/06/17  Yes [provider]  omeprazole (PRILOSEC) 40 MG capsule Take 40 mg by mouth daily. 01/08/18  Yes [provider]  traMADol (ULTRAM) 50 MG tablet Take 1 tablet (50 mg total) by mouth every 12 (twelve) hours as needed for moderate pain. Patient taking differently: Take 50 mg by mouth 2 (two) times daily.  12/25/17  Yes Ghimire, Henreitta Leber, MD  amoxicillin-clavulanate (AUGMENTIN) 500-125 MG tablet Take 1 tablet (500 mg total) by mouth 2 (two) times daily. Patient not taking: Reported on 01/19/2018 12/25/17   Jonetta Osgood, MD  esomeprazole (NEXIUM) 40 MG capsule Take 1 capsule (40 mg total) by mouth 2 (two) times daily before a meal. Patient not taking: Reported on 01/19/2018 12/25/17   Jonetta Osgood, MD  potassium chloride SA (K-DUR,KLOR-CON) 20 MEQ tablet Take 2 tablets (40 mEq total) by mouth daily. Patient not taking: Reported on 01/19/2018 12/25/17   Jonetta Osgood, MD  sucralfate (CARAFATE) 1 GM/10ML suspension Take 10 mLs (1 g total) by mouth 4 (four) times daily -  with meals and at bedtime. Patient not taking: Reported on 01/19/2018 12/25/17   Jonetta Osgood, MD    Allergies  Allergen Reactions  . Aspirin Other (See Comments)    Stomach bleed    Physical Exam  Vitals  Blood pressure (!) 124/56, pulse 72, temperature 98 F (36.7 C), temperature source Oral, resp. rate 18, SpO2 99 %.   1. General well-nourished, well-developed, extremely pleasant  2. , pleasantly confused.  3. No F.N deficits, grossly, patient moving all extremities.  4. Ears and Eyes appear Normal, Conjunctivae injection, dry oral Mucosa.  5. Supple Neck, No JVD, No cervical lymphadenopathy  appriciated, No Carotid Bruits.  6. Symmetrical Chest wall movement, Good air movement bilaterally, CTAB.  7. RRR, systolic murmur 3/6.  8. Positive Bowel Sounds, Abdomen Soft, Non tender, No organomegaly appriciated,No rebound -guarding or rigidity.  9.  No Cyanosis, decreased muscle turgor.  10. Good muscle tone, lower extremity stasis dermatitis bilaterally with  the right leg erythema, ulcers.  Angry looking right leg.    Data Review  CBC Recent Labs  Lab 01/19/18 1713  WBC 12.9*  HGB 7.4*  HCT 26.6*  PLT 282  MCV 107.7*  MCH 30.0  MCHC 27.8*  RDW 16.0*  LYMPHSABS 1.6  MONOABS 1.0  EOSABS 0.3  BASOSABS 0.0   ------------------------------------------------------------------------------------------------------------------  Chemistries  Recent Labs  Lab 01/19/18 1713  NA 127*  K 5.8*  CL 99  CO2 17*  GLUCOSE 103*  BUN 52*  CREATININE 1.62*  CALCIUM 7.7*   ------------------------------------------------------------------------------------------------------------------ CrCl cannot be calculated (Unknown ideal weight.). ------------------------------------------------------------------------------------------------------------------ No results for input(s): TSH, T4TOTAL, T3FREE, THYROIDAB in the last 72 hours.  Invalid input(s): FREET3   Coagulation profile No results for input(s): INR, PROTIME in the last 168 hours. ------------------------------------------------------------------------------------------------------------------- No results for input(s): DDIMER in the last 72 hours. -------------------------------------------------------------------------------------------------------------------  Cardiac Enzymes No results for input(s): CKMB, TROPONINI, MYOGLOBIN in the last 168 hours.  Invalid input(s): CK ------------------------------------------------------------------------------------------------------------------ Invalid input(s):  POCBNP   ---------------------------------------------------------------------------------------------------------------  Urinalysis    Component Value Date/Time   COLORURINE YELLOW 12/19/2017 2303   APPEARANCEUR CLEAR 12/19/2017 2303   LABSPEC 1.015 12/19/2017 2303   PHURINE 5.0 12/19/2017 2303   GLUCOSEU NEGATIVE 12/19/2017 2303   HGBUR NEGATIVE 12/19/2017 2303   BILIRUBINUR NEGATIVE 12/19/2017 2303   KETONESUR NEGATIVE 12/19/2017 2303   PROTEINUR 100 (A) 12/19/2017 2303   NITRITE NEGATIVE 12/19/2017 2303   LEUKOCYTESUR NEGATIVE 12/19/2017 2303    ----------------------------------------------------------------------------------------------------------------   Imaging results:   Dg Chest Port 1 View  Result Date: 01/19/2018 CLINICAL DATA:  Edema and bilateral legs. EXAM: PORTABLE CHEST 1 VIEW COMPARISON:  May 25, 2008 FINDINGS: The study is limited due to positioning. Probable small right layering effusion. No overt edema. No acute abnormalities definitely feet. IMPRESSION: Limited study due to positioning. Probable tiny right pleural effusion. No other acute abnormalities noted. Electronically Signed   By: Dorise Bullion III M.D   On: 01/19/2018 17:28   Dg Duanne Limerick W/water Sol Cm  Result Date: 12/23/2017 CLINICAL DATA:  Patient presents for water-soluble upper GI evaluation for a possible perforated ulcer. Patient underwent CT evaluation on 12/20/2017, which showed a small amount of free air. A possible per serrated duodenal ulcer with suggested. Current exam is significantly limited by the patient's significant back disease. She was unable to lay flat or prone. She is significantly kyphotic. The exam was performed with the patient standing, with areas right lateral obliquities performed to assess the gastric antrum and duodenum. Patient was only able to stand for approximately 5 minutes. EXAM: WATER SOLUBLE UPPER GI SERIES TECHNIQUE: Single-column upper GI series was performed using  water soluble contrast. CONTRAST:  58mL OMNIPAQUE IOHEXOL 300 MG/ML  SOLN COMPARISON:  None. FLUOROSCOPY TIME:  Fluoroscopy Time:  1 minutes and 18 seconds Radiation Exposure Index (if provided by the fluoroscopic device): 14 188 micro Gy per meter squared. Number of Acquired Spot Images: 17 FINDINGS: The gastric antrum appears narrowed with fold thickening. There is fold thickening noted along the duodenal bulb and second portion of the duodenum. No contrast extravasation is seen to defined ulcer. Contrast extended promptly from the stomach into the small bowel. No evidence of obstruction. IMPRESSION: 1. Limited water-soluble single contrast upper GI. There are findings consistent with inflammation of the gastric antrum and proximal duodenum. No discrete ulcer was defined on this exam and there was no contrast extravasation to indicate a perforated ulcer. Electronically Signed   By: Dedra Skeens.D.  On: 12/23/2017 09:45      Assessment & Plan  1.  Cellulitis with ulcer of the right leg with history of bilateral lower extremity stasis dermatitis      Elevate lower extremities      IV antibiotics, Ancef  2.  Lower extremity stasis dermatitis , chronic      C/W lasix , Unna boots when acute epsisode resolves  3.  Renal insufficiency      IV fluids, low rate      Hold lisinopril      Decrease Lasix  4.  Hyponatremia      Fluid restriction      Decrease Lasix  5.  Hypertension      Continue with Tenormin, Cardura and hydralazine, off lisinopril  6.  Hypothyroidism     Continue with Synthroid  7.  High MCV anemia     Check vitamin B12 and folate     Continue with FeSO4  8.  Pelvic fracture      Exhibited treatment  9.  Duodenal ulcer      Continue with Protonix  10.  Sacral decubitus ulcer      Wound care management          DVT Prophylaxis Heparin  AM Labs Ordered, also please review Full Orders  Family Communication: Admission, patients condition and plan of care  including tests being ordered have been discussed with the daughter and son who indicate understanding and agree with the plan and Code Status.  They want to discuss the case with social work and the consult will be done in the morning  Code Status full  Disposition Plan: Undetermined  Time spent in minutes : 43 minutes  Condition GUARDED   @SIGNATURE @

## 2018-01-19 NOTE — ED Notes (Signed)
Light green tube sent to lab for BNP.

## 2018-01-20 DIAGNOSIS — I10 Essential (primary) hypertension: Secondary | ICD-10-CM

## 2018-01-20 DIAGNOSIS — L8990 Pressure ulcer of unspecified site, unspecified stage: Secondary | ICD-10-CM

## 2018-01-20 DIAGNOSIS — E875 Hyperkalemia: Secondary | ICD-10-CM

## 2018-01-20 DIAGNOSIS — N179 Acute kidney failure, unspecified: Secondary | ICD-10-CM

## 2018-01-20 DIAGNOSIS — D539 Nutritional anemia, unspecified: Secondary | ICD-10-CM

## 2018-01-20 DIAGNOSIS — E871 Hypo-osmolality and hyponatremia: Secondary | ICD-10-CM

## 2018-01-20 LAB — BASIC METABOLIC PANEL
Anion gap: 7 (ref 5–15)
BUN: 49 mg/dL — AB (ref 8–23)
CALCIUM: 7.6 mg/dL — AB (ref 8.9–10.3)
CO2: 19 mmol/L — ABNORMAL LOW (ref 22–32)
CREATININE: 1.6 mg/dL — AB (ref 0.44–1.00)
Chloride: 103 mmol/L (ref 98–111)
GFR calc Af Amer: 32 mL/min — ABNORMAL LOW (ref >60)
GFR calc non Af Amer: 27 mL/min — ABNORMAL LOW (ref >60)
GLUCOSE: 91 mg/dL (ref 70–99)
Potassium: 4.5 mmol/L (ref 3.5–5.1)
Sodium: 129 mmol/L — ABNORMAL LOW (ref 135–145)

## 2018-01-20 LAB — TSH: TSH: 4.124 u[IU]/mL (ref 0.350–4.500)

## 2018-01-20 LAB — VITAMIN B12: Vitamin B-12: 330 pg/mL (ref 180–914)

## 2018-01-20 MED ORDER — SODIUM CHLORIDE 0.9 % IV SOLN
INTRAVENOUS | Status: DC
  Administered 2018-01-20 (×2): via INTRAVENOUS

## 2018-01-20 MED ORDER — CEFAZOLIN SODIUM-DEXTROSE 1-4 GM/50ML-% IV SOLN
1.0000 g | Freq: Two times a day (BID) | INTRAVENOUS | Status: DC
  Administered 2018-01-20 – 2018-01-21 (×4): 1 g via INTRAVENOUS
  Filled 2018-01-20 (×6): qty 50

## 2018-01-20 NOTE — Clinical Social Work Note (Addendum)
Clinical Social Work Assessment  Patient Details  Name: [name] MRN: [id number] Date of Birth: [birth year]  Date of referral:  01/20/18               Reason for consult:  Discharge Planning, Facility Placement                Permission sought to share information with:    Permission granted to share information::  Yes, Verbal Permission Granted  Name::      [name]  Agency::  SNF   Relationship::   Son   Contact Information:    [phone]  Housing/Transportation Living arrangements for the past 2 months:  [place], [place] of Information:  Patient Patient Interpreter Needed:  None Criminal Activity/Legal Involvement Pertinent to Current Situation/Hospitalization:  No - Comment as needed Significant Relationships:  None Lives with:  Adult Children Do you feel safe going back to the place where you live?  Yes Need for family participation in patient care:  Yes (Comment)  Care giving concerns:   Patient family is concerned about the patient health decline and need additional care (SNF for short rehab to determine if the patient will regain her baseline level of functioning. He reports the patient is in constant pain due to past pelvic fracture and leg wounds.  The patient was at [facility] for therapy about 3 weeks ago and was discharge home after insurance would not continue to pay. Patient son reports she [facility] and nurse aid assisting with the patient bathing and wound dressing.   Patient son [name] reports he lives in home with the patient and has been helping with her wound care as well. He reports the wounds have progressively worsened w/ odor and drainage.   He reports the patient has not been eating her usual meal portions (a half sandwich and salad).  Patient son [name] would prefer the patient go back at [facility] for short rehab if space is available. [name] feels the patient will regain her strength with therapy at SNF.    The physician placed a formative palliative consult for the family to discuss the patient goals of care.    Social Worker assessment / plan:  CSW listened to patient son concerns. CSW will continue to assist with placement needs.    Plan: SNF  Employment status:  Retired Nurse, adult PT Recommendations:  [place] / Referral to community resources:  [place]  Patient/Family's Response to care:  Agreeable to care.   Patient/Family's Understanding of and Emotional Response to Diagnosis, Current Treatment, and Prognosis: Patient son is a respiratory therapist and the granddaughter is a Education officer, museum with knowledge of the patient care.   Emotional Assessment Appearance:  Appears stated age Attitude/Demeanor/Rapport:    Affect (typically observed):    Orientation:  Oriented to Self Alcohol / Substance use:  Not Applicable Psych involvement (Current and /or in the community):  No (Comment)  Discharge Needs  Concerns to be addressed:  Discharge Planning Concerns Readmission within the last 30 days:  yes Current discharge risk:  Dependent with Mobility, Physical Impairment Barriers to Discharge:  Continued Medical Work up, Maria Antonia, LCSW 01/20/2018, 1:17 PM

## 2018-01-20 NOTE — Progress Notes (Addendum)
Order for foley catheter.  Pt came up from ED w/ purewick; working adequately.  Paged [place], NP, to ask if purewick can remain in place of foley.  Also, needed to clarify seizure prec order.  Awaitng reply

## 2018-01-20 NOTE — Progress Notes (Signed)
Palliative Medicine consult noted. Due to high referral volume, there may be a delay seeing this patient. Please call the Palliative Medicine Team office at [phone] if recommendations are needed in the interim.  Thank you for inviting [place] to see this patient.  Marjie Skiff Rileyann Florance, RN, BSN, [facility] Palliative Medicine Team 01/20/2018 3:15 PM Office [phone]

## 2018-01-20 NOTE — Care Management Note (Signed)
Case Management Note  Patient Details  Name: [name] MRN: [id number] Date of Birth: [birth year]  Subjective/Objective:     Plan for d/c to SNF, discharge planning per CSW. [phone]               Action/Plan:   Expected Discharge Date:  01/24/18               Expected Discharge Plan:  [place]  In-House Referral:  NA  Discharge planning Services  CM Consult  Post Acute Care Choice:  NA Choice offered to:  Patient, Adult Children  DME Arranged:  N/A DME Agency:  NA  HH Arranged:  NA HH Agency:  NA  Status of Service:  Completed, signed off  If discussed at [place] of Stay Meetings, dates discussed:    Additional Comments:  Guadalupe Maple, RN 01/20/2018, 10:09 AM

## 2018-01-20 NOTE — Evaluation (Signed)
Physical Therapy Evaluation Patient Details Name: [name] MRN: [id number] DOB: 1926-04-09 Today's Date: 01/20/2018   History of Present Illness  [name]  is a 84 y.o. female, with  presenting with 2 weeks history of increasing redness with oozing ulcers from the right leg.   The patient was recently discharged from our facility after admission for duodenal ulcer with perforation; past medical history significant for bilateral lower extremity stasis dermatitis, recent pelvic fracture in August 2019, sacral decubitus ulcer, spinal stenosis, kyphosis, HTN, arthritis  Clinical Impression  Pt admitted with above diagnosis. Pt currently with functional limitations due to the deficits listed below (see PT Problem List). Pt cooperative, aroused with multi-modal stimuli although lethargic on arrival; mild confusion, assisted pt in placing battery in hearing aid which improved communication although pt unsure where she was (oriented to [facility]); pt is significantly deconditioned and at risk for additional falls--- although it is difficult to get a true picture of her baseline; recommend SNF at this time unless family desires home, then will need HHPT and 24hr assist;   Pt will benefit from skilled PT to increase their independence and safety with mobility to allow discharge to the venue listed below.       Follow Up Recommendations SNF(vs home with 24hour assist, HHPT)    Equipment Recommendations  None recommended by PT    Recommendations for Other Services       Precautions / Restrictions Precautions Precautions: Fall Restrictions Weight Bearing Restrictions: No      Mobility  Bed Mobility Overal bed mobility: Needs Assistance Bed Mobility: Supine to Sit     Supine to sit: Min assist     General bed mobility comments: assis to elevate trunk  Transfers Overall transfer level: Needs assistance Equipment used: Rolling walker (2 wheeled);None Transfers: Audiological scientist;Sit to/from Stand Sit to Stand: Min assist Stand pivot transfers: Mod assist       General transfer comment: repeated sit to stand and stand pivot x2 (for toileting); assist to rise and stabilize, assist for balance to perform pivot, signigicant trunk and hip flexion in standing  Ambulation/Gait             General Gait Details: NT  Stairs            Wheelchair Mobility    Modified Rankin (Stroke Patients Only)       Balance Overall balance assessment: Needs assistance;History of Falls Sitting-balance support: Feet supported;No upper extremity supported Sitting balance-Leahy Scale: Fair       Standing balance-Leahy Scale: Poor Standing balance comment: reliant on UEs and external assist                             Pertinent Vitals/Pain Pain Assessment: No/denies pain Faces Pain Scale: Hurts little more Pain Location: back  Pain Descriptors / Indicators: Grimacing Pain Intervention(s): Monitored during session;Repositioned    Home Living Family/patient expects to be discharged to:: Skilled nursing facility Living Arrangements: Children(lives with son)                    Prior Function           Comments: amb with rollator per pt, no family present at time of Eval, pt is not abl eto give complete hx     Hand Dominance        Extremity/Trunk Assessment   Upper Extremity Assessment Upper Extremity Assessment: Generalized weakness  Lower Extremity Assessment Lower Extremity Assessment: Generalized weakness(LEs edematous, bandaged)    Cervical / Trunk Assessment Cervical / Trunk Assessment: Kyphotic  Communication   Communication: HOH  Cognition Arousal/Alertness: Awake/alert Behavior During Therapy: WFL for tasks assessed/performed   Area of Impairment: Following commands;Safety/judgement;Orientation                 Orientation Level: Disoriented to;Place;Time     Following Commands: Follows one  step commands inconsistently Safety/Judgement: Decreased awareness of safety            General Comments      Exercises     Assessment/Plan    PT Assessment Patient needs continued PT services  PT Problem List Decreased strength;Decreased mobility;Decreased balance;Decreased activity tolerance       PT Treatment Interventions DME instruction;Gait training;Functional mobility training;Therapeutic activities;Therapeutic exercise;Balance training;Patient/family education    PT Goals (Current goals can be found in the Care Plan section)  Acute Rehab PT Goals Time For Goal Achievement: 02/03/18 Potential to Achieve Goals: Fair    Frequency Min 2X/week   Barriers to discharge        Co-evaluation               AM-PAC PT "6 Clicks" Daily Activity  Outcome Measure Difficulty turning over in bed (including adjusting bedclothes, sheets and blankets)?: Unable Difficulty moving from lying on back to sitting on the side of the bed? : Unable Difficulty sitting down on and standing up from a chair with arms (e.g., wheelchair, bedside commode, etc,.)?: Unable Help needed moving to and from a bed to chair (including a wheelchair)?: A Lot Help needed walking in hospital room?: A Lot Help needed climbing 3-5 steps with a railing? : Total 6 Click Score: 8    End of Session Equipment Utilized During Treatment: Gait belt Activity Tolerance: Patient tolerated treatment well;Patient limited by fatigue Patient left: in chair;with call bell/phone within reach;with chair alarm set   PT Visit Diagnosis: Muscle weakness (generalized) (M62.81);Difficulty in walking, not elsewhere classified (R26.2)    Time: 8588-5027 PT Time Calculation (min) (ACUTE ONLY): 35 min   Charges:   PT Evaluation $PT Eval Low Complexity: 1 Low PT Treatments $Therapeutic Activity: 8-22 mins        Kenyon Ana, PT  Pager: [phone] Acute Rehab Dept [facility]):  [phone]   01/20/2018   [facility] 01/20/2018, 2:42 PM

## 2018-01-20 NOTE — Progress Notes (Addendum)
[facility] PROGRESS NOTE    Progress Note  [name]  ATF:[id number] DOB: 09-Jan-1927 DOA: 01/19/2018 PCP: [name], MD     Brief Narrative:   [name] is an 84 y.o. female past medical history of bilateral lower extremity stasis dermatitis, recently discharged from our facility for a duodenal ulcer with preparations and a pelvic fracture, also with past medical history of sacral decubitus ulcer presents with 2-week history of increased redness and oozing from ulcers in the right leg  Assessment/Plan:   Cellulitis of right leg Started empirically on IV Ancef on admission. She has defervesced, she remains with a white count of 12.9. Repeat a CBC in the morning. Blood cultures were ordered on admission. We will get wound care consult. She has had a significant decline over the past several months we will consult hospice and palliative care to discuss with the family.  Pressure injury of skin  AKI (acute kidney injury) ([place]): With a baseline creatinine of 1.2, on admission 1.6 hold Lasix and lisinopril. Liberalize diet recheck basic metabolic panel in the morning.  Hypovolemic hyponatremia: Discontinue Lasix liberalize diet started on gentle IV fluid hydration for the next 24 hours.  Hyperkalemia: In the setting of ACE inhibitor and Lasix resolved with IV fluid hydration. Continue to hold ACE inhibitor and Lasix.  Essential hypertension Pressure has been stable. Continue to hold antihypertensive medication.  Macrocytic anemia Follow with PCP as an outpatient.  DVT prophylaxis: Heparin Family Communication:SOn Disposition Plan/Barrier to D/C: unable to determin Code Status:     Code Status Orders  (From admission, onward)         Start     Ordered   01/19/18 2114  Full code  Continuous     01/19/18 2113        Code Status History    Date Active Date Inactive Code Status Order ID Comments User Context   12/20/2017 1131 12/25/2017  1629 Full Code [id number]  Sid Falcon, MD Inpatient    Advance Directive Documentation     Most Recent Value  Type of Advance Directive  Healthcare Power of [place], Living will  Pre-existing out of facility DNR order (yellow form or pink MOST form)  -  "MOST" Form in Place?  -        IV Access:    Peripheral IV   Procedures and diagnostic studies:   Dg Chest Port 1 View  Result Date: 01/19/2018 CLINICAL DATA:  Edema and bilateral legs. EXAM: PORTABLE CHEST 1 VIEW COMPARISON:  May 25, 2008 FINDINGS: The study is limited due to positioning. Probable small right layering effusion. No overt edema. No acute abnormalities definitely feet. IMPRESSION: Limited study due to positioning. Probable tiny right pleural effusion. No other acute abnormalities noted. Electronically Signed   By: Dorise Bullion III M.D   On: 01/19/2018 17:28     Medical Consultants:    None.  Anti-Infectives:   Ancef  Subjective:    [name] sleepy does not want to wake up she relates that she wants to be left alone this morning.  Objective:    Vitals:   01/19/18 1947 01/19/18 2113 01/19/18 2115 01/20/18 0628  BP: 117/90  (!) 108/58 106/63  Pulse: 83  72 70  Resp: 15  16 17   Temp:   98.5 F (36.9 C) 98.3 F (36.8 C)  TempSrc:   Oral Oral  SpO2: 95%  96% 99%  Weight:  56.5 kg    Height:  5\' 2"  (1.575 m)      Intake/Output Summary (Last 24 hours) at 01/20/2018 0812 Last data filed at 01/20/2018 0600 Gross per 24 hour  Intake 405.37 ml  Output 500 ml  Net -94.63 ml   Filed Weights   01/19/18 2113  Weight: 56.5 kg    Exam: General exam: In no acute distress. Respiratory system: Good air movement and clear to auscultation. Cardiovascular system: S1 & S2 heard, RRR.  Gastrointestinal system: Abdomen is nondistended, soft and nontender.  Central nervous system: Alert and oriented. No focal neurological deficits. Extremities: No pedal edema. Skin: No rashes, lesions  or ulcers   Data Reviewed:    Labs: Basic Metabolic Panel: Recent Labs  Lab 01/19/18 1713 01/20/18 0418  NA 127* 129*  K 5.8* 4.5  CL 99 103  CO2 17* 19*  GLUCOSE 103* 91  BUN 52* 49*  CREATININE 1.62* 1.60*  CALCIUM 7.7* 7.6*   GFR Estimated Creatinine Clearance: 18.9 mL/min (A) (by C-G formula based on SCr of 1.6 mg/dL (H)). Liver Function Tests: No results for input(s): AST, ALT, ALKPHOS, BILITOT, PROT, ALBUMIN in the last 168 hours. No results for input(s): LIPASE, AMYLASE in the last 168 hours. No results for input(s): AMMONIA in the last 168 hours. Coagulation profile No results for input(s): INR, PROTIME in the last 168 hours.  CBC: Recent Labs  Lab 01/19/18 1713  WBC 12.9*  NEUTROABS 9.9*  HGB 7.4*  HCT 26.6*  MCV 107.7*  PLT 282   Cardiac Enzymes: No results for input(s): CKTOTAL, CKMB, CKMBINDEX, TROPONINI in the last 168 hours. BNP (last 3 results) No results for input(s): PROBNP in the last 8760 hours. CBG: No results for input(s): GLUCAP in the last 168 hours. D-Dimer: No results for input(s): DDIMER in the last 72 hours. Hgb A1c: No results for input(s): HGBA1C in the last 72 hours. Lipid Profile: No results for input(s): CHOL, HDL, LDLCALC, TRIG, CHOLHDL, LDLDIRECT in the last 72 hours. Thyroid function studies: Recent Labs    01/20/18 0418  TSH 4.124   Anemia work up: Recent Labs    01/20/18 0418  VITAMINB12 330   Sepsis Labs: Recent Labs  Lab 01/19/18 1713  WBC 12.9*   Microbiology No results found for this or any previous visit (from the past 240 hour(s)).   Medications:   . acetaminophen  1,000 mg Oral BID  . atenolol  100 mg Oral q morning - 10a  . doxazosin  4 mg Oral QHS  . ferrous sulfate  325 mg Oral Q breakfast  . furosemide  20 mg Oral QPC breakfast  . heparin  5,000 Units Subcutaneous Q8H  . hydrALAZINE  50 mg Oral BID  . levothyroxine  125 mcg Oral QAC breakfast  . pantoprazole  40 mg Oral Daily  .  sucralfate  1 g Oral TID WC & HS   Continuous Infusions: . sodium chloride 50 mL/hr at 01/19/18 2122  .  ceFAZolin (ANCEF) IV 1 g (01/20/18 0222)      LOS: 1 day   Charlynne Cousins  [facility] Pager [phone]  *Please refer to [email or web address], password [email or web address] to get updated schedule on who will round on this patient, as hospitalists switch teams weekly. If 7PM-7AM, please contact night-coverage at [email or web address], password [email or web address] for any overnight needs.  01/20/2018, 8:12 AM

## 2018-01-20 NOTE — Consult Note (Signed)
[place] Nurse wound consult note. Patient evaluated at [id number]. No family present. Patient did not open eyes and verbal interaction was limited to "that hurts" while performing dressings to legs. Patient tolerated well.  Reason for Consult: Multiple wounds. Wound types: BLE consistent with venous stasis disease. Left posterior leg, wound measures 2cm x 3 cm and is 100% pink, heavily exudating serous fluid. Right lower leg entire gator area with multiple heavily exudating shallow wounds. Also consistent with venous stasis. Bilat lower legs erythematous, R > L. Plan of care for the areas: daily application of Aquacel Ag+, ABD pads and kerlex; next change date 10/29.  Right first metatarsal head. Plantar surface with a heavy callused discolored area measure 0.8cm x 0.6cm, no drainage expressed. No treatment necessary, just monitor.  Coccyx wound measure 1.5cm x 1.8cm, is 100% yellow base, unstagable PI. Plan of care for this area: twice saline moistened gauze and ABD pads.  Left buttock has an area with a rolled wound edge, likely related to friction, shear and moisture damage. Plan of care for this area: thin hydrocolloid, change every 3 days.  Additional plan of care items include: antifungal powder to buttocks and perineum TID; Low air loss mattress; turning instructions and linen instructions.  Pressure Injury POA: Yes Monitor the wound area(s) for worsening of condition such as: Signs/symptoms of infection,  Increase in size,  Development of or worsening of odor, Development of pain, or increased pain at the affected locations.  Notify the medical team if any of these develop.  Thank you for the consult.  Discussed plan of care with the patient and bedside nurse.  [place] nurse will not follow at this time.  Please re-consult the [facility] team if needed.  Val Riles, RN, MSN, CWOCN, CNS-BC, pager [phone]

## 2018-01-21 DIAGNOSIS — R231 Pallor: Secondary | ICD-10-CM

## 2018-01-21 DIAGNOSIS — Z7189 Other specified counseling: Secondary | ICD-10-CM

## 2018-01-21 LAB — CBC WITH DIFFERENTIAL/PLATELET
Abs Immature Granulocytes: 0.04 10*3/uL (ref 0.00–0.07)
BASOS PCT: 0 %
Basophils Absolute: 0 10*3/uL (ref 0.0–0.1)
EOS ABS: 0.4 10*3/uL (ref 0.0–0.5)
Eosinophils Relative: 3 %
HEMATOCRIT: 21.7 % — AB (ref 36.0–46.0)
Hemoglobin: 6.7 g/dL — CL (ref 12.0–15.0)
IMMATURE GRANULOCYTES: 0 %
Lymphocytes Relative: 19 %
Lymphs Abs: 1.9 10*3/uL (ref 0.7–4.0)
MCH: 29.6 pg (ref 26.0–34.0)
MCHC: 30.9 g/dL (ref 30.0–36.0)
MCV: 96 fL (ref 80.0–100.0)
MONO ABS: 0.7 10*3/uL (ref 0.1–1.0)
MONOS PCT: 7 %
NEUTROS PCT: 71 %
Neutro Abs: 7.2 10*3/uL (ref 1.7–7.7)
Platelets: 331 10*3/uL (ref 150–400)
RBC: 2.26 MIL/uL — ABNORMAL LOW (ref 3.87–5.11)
RDW: 16 % — AB (ref 11.5–15.5)
WBC: 10.2 10*3/uL (ref 4.0–10.5)
nRBC: 0 % (ref 0.0–0.2)

## 2018-01-21 LAB — FOLATE RBC
FOLATE, HEMOLYSATE: 303.3 ng/mL
Folate, RBC: 1480 ng/mL (ref >498)
HEMATOCRIT: 20.5 % — AB (ref 34.0–46.6)

## 2018-01-21 LAB — ABO/RH: ABO/RH(D): O NEG

## 2018-01-21 LAB — PREPARE RBC (CROSSMATCH)

## 2018-01-21 MED ORDER — CYANOCOBALAMIN 1000 MCG/ML IJ SOLN
1000.0000 ug | Freq: Every day | INTRAMUSCULAR | Status: DC
  Administered 2018-01-22 – 2018-01-23 (×2): 1000 ug via INTRAMUSCULAR
  Filled 2018-01-21 (×2): qty 1

## 2018-01-21 MED ORDER — CYANOCOBALAMIN 1000 MCG/ML IJ SOLN
1000.0000 ug | Freq: Once | INTRAMUSCULAR | Status: AC
  Administered 2018-01-21: 1000 ug via INTRAMUSCULAR
  Filled 2018-01-21 (×2): qty 1

## 2018-01-21 MED ORDER — SODIUM CHLORIDE 0.9% IV SOLUTION
Freq: Once | INTRAVENOUS | Status: AC
  Administered 2018-01-21: 18:00:00 via INTRAVENOUS

## 2018-01-21 MED ORDER — SODIUM CHLORIDE 0.9 % IV SOLN
INTRAVENOUS | Status: DC
  Administered 2018-01-21: 12:00:00 via INTRAVENOUS

## 2018-01-21 NOTE — Consult Note (Signed)
[facility] Nurse wound follow up I spoke with the patient's primary RN, [facility] Butts for [id number].  She tells me the patient has a right medial first metatarsal head fissue that opened while she was up today to the chair.  She states she dressed it with Xeroform and foam pad.  This is appropriate for this type of tissue impairment.  I will enter an order for this.  Also, she states that the patient's son is stating the patient's legs are more red today than yesterday.  This is to be expected when the patient's legs are in a dependent position.  I understand the patient will not allow her legs to be elevated when she is in the recliner chair.  No additional needs identified. Monitor the wound area(s) for worsening of condition such as: Signs/symptoms of infection,  Increase in size,  Development of or worsening of odor, Development of pain, or increased pain at the affected locations.  Notify the medical team if any of these develop.  Thank you for the consult.  Discussed plan of care with the patient and bedside nurse.  [place] nurse will not follow at this time.  Please re-consult the [facility] team if needed.  Val Riles, RN, MSN, CWOCN, CNS-BC, pager [phone]

## 2018-01-21 NOTE — Progress Notes (Addendum)
[facility] PROGRESS NOTE    Progress Note  [name]  DTO:[id number] DOB: [birth year] DOA: 01/19/2018 PCP: [name], MD     Brief Narrative:   [name] is an 84 y.o. female past medical history of bilateral lower extremity stasis dermatitis, recently discharged from our facility for a duodenal ulcer with preparations and a pelvic fracture, also with past medical history of sacral decubitus ulcer presents with 2-week history of increased redness and oozing from ulcers in the right leg  Assessment/Plan:   Cellulitis of right leg Started empirically on IV Ancef on admission. She has defervesced, she remains with a white count of 12.9. Repeat a CBC in the morning. Blood cultures pending. Awaiting WOC recommendations. Physical therapy evaluated the patient recommended skilled nursing facility. Patient has significant decline over the last several months awaiting hospice imperative care meeting with family.  Pressure injury of skin  AKI (acute kidney injury) ([place]): With a baseline creatinine of 1.2, on admission 1.6 hold Lasix and lisinopril. Recheck a basic metabolic panel in the morning, start on low rate normal saline.  Hypovolemic hyponatremia: Gentle IV fluids repeat basic metabolic panel in the morning.  Hyperkalemia: In the setting of ACE inhibitor and Lasix resolved with IV fluid hydration. Continue to hold ACE inhibitor and Lasix.  Essential hypertension Pressure has been stable. Continue to hold antihypertensive medication.  Macrocytic anemia B12 is 332 we will start B12 supplementation intramuscularly. Hbg today 6.7 transfuse 2 unit of PRBC, KVO iv fluids.  DVT prophylaxis: Heparin Family Communication:SOn Disposition Plan/Barrier to D/C: unable to determin Code Status:     Code Status Orders  (From admission, onward)         Start     Ordered   01/19/18 2114  Full code  Continuous     01/19/18 2113        Code Status  History    Date Active Date Inactive Code Status Order ID Comments User Context   12/20/2017 1131 12/25/2017 1629 Full Code [id number]  Sid Falcon, MD Inpatient    Advance Directive Documentation     Most Recent Value  Type of Advance Directive  Healthcare Power of [place], Living will  Pre-existing out of facility DNR order (yellow form or pink MOST form)  -  "MOST" Form in Place?  -        IV Access:    Peripheral IV   Procedures and diagnostic studies:   Dg Chest Port 1 View  Result Date: 01/19/2018 CLINICAL DATA:  Edema and bilateral legs. EXAM: PORTABLE CHEST 1 VIEW COMPARISON:  May 25, 2008 FINDINGS: The study is limited due to positioning. Probable small right layering effusion. No overt edema. No acute abnormalities definitely feet. IMPRESSION: Limited study due to positioning. Probable tiny right pleural effusion. No other acute abnormalities noted. Electronically Signed   By: Dorise Bullion III M.D   On: 01/19/2018 17:28     Medical Consultants:    None.  Anti-Infectives:   Ancef  Subjective:    Tacy Learn patient slept well overnight she is more awake today she relates she feels much better.  Objective:    Vitals:   01/20/18 0628 01/20/18 1428 01/20/18 2214 01/21/18 0459  BP: 106/63 (!) 96/51 (!) 114/97 (!) 95/54  Pulse: 70 83 66 61  Resp: 17 16 18 15   Temp: 98.3 F (36.8 C) 97.7 F (36.5 C) (!) 97.5 F (36.4 C) 99.2 F (37.3 C)  TempSrc: Oral Oral Oral Oral  SpO2: 99% 98% 96% 97%  Weight:      Height:        Intake/Output Summary (Last 24 hours) at 01/21/2018 1127 Last data filed at 01/21/2018 0808 Gross per 24 hour  Intake 1584.49 ml  Output 700 ml  Net 884.49 ml   Filed Weights   01/19/18 2113  Weight: 56.5 kg    Exam: General exam: In no acute distress. Respiratory system: Good air movement and clear to auscultation. Cardiovascular system: S1 & S2 heard, RRR.  Gastrointestinal system: Abdomen is nondistended,  soft and nontender.  Central nervous system: Alert and oriented. No focal neurological deficits. Extremities: No pedal edema. Skin: No rashes, lesions or ulcers   Data Reviewed:    Labs: Basic Metabolic Panel: Recent Labs  Lab 01/19/18 1713 01/20/18 0418  NA 127* 129*  K 5.8* 4.5  CL 99 103  CO2 17* 19*  GLUCOSE 103* 91  BUN 52* 49*  CREATININE 1.62* 1.60*  CALCIUM 7.7* 7.6*   GFR Estimated Creatinine Clearance: 18.9 mL/min (A) (by C-G formula based on SCr of 1.6 mg/dL (H)). Liver Function Tests: No results for input(s): AST, ALT, ALKPHOS, BILITOT, PROT, ALBUMIN in the last 168 hours. No results for input(s): LIPASE, AMYLASE in the last 168 hours. No results for input(s): AMMONIA in the last 168 hours. Coagulation profile No results for input(s): INR, PROTIME in the last 168 hours.  CBC: Recent Labs  Lab 01/19/18 1713  WBC 12.9*  NEUTROABS 9.9*  HGB 7.4*  HCT 26.6*  MCV 107.7*  PLT 282   Cardiac Enzymes: No results for input(s): CKTOTAL, CKMB, CKMBINDEX, TROPONINI in the last 168 hours. BNP (last 3 results) No results for input(s): PROBNP in the last 8760 hours. CBG: No results for input(s): GLUCAP in the last 168 hours. D-Dimer: No results for input(s): DDIMER in the last 72 hours. Hgb A1c: No results for input(s): HGBA1C in the last 72 hours. Lipid Profile: No results for input(s): CHOL, HDL, LDLCALC, TRIG, CHOLHDL, LDLDIRECT in the last 72 hours. Thyroid function studies: Recent Labs    01/20/18 0418  TSH 4.124   Anemia work up: Recent Labs    01/20/18 0418  VITAMINB12 330   Sepsis Labs: Recent Labs  Lab 01/19/18 1713  WBC 12.9*   Microbiology No results found for this or any previous visit (from the past 240 hour(s)).   Medications:   . acetaminophen  1,000 mg Oral BID  . atenolol  100 mg Oral q morning - 10a  . doxazosin  4 mg Oral QHS  . ferrous sulfate  325 mg Oral Q breakfast  . heparin  5,000 Units Subcutaneous Q8H  .  hydrALAZINE  50 mg Oral BID  . levothyroxine  125 mcg Oral QAC breakfast  . pantoprazole  40 mg Oral Daily  . sucralfate  1 g Oral TID WC & HS   Continuous Infusions: .  ceFAZolin (ANCEF) IV 1 g (01/21/18 1116)      LOS: 2 days   [facility] Pager [phone]  *Please refer to [email or web address], password [email or web address] to get updated schedule on who will round on this patient, as hospitalists switch teams weekly. If 7PM-7AM, please contact night-coverage at [email or web address], password [email or web address] for any overnight needs.  01/21/2018, 11:27 AM

## 2018-01-21 NOTE — NC FL2 (Addendum)
[name] LEVEL OF CARE SCREENING TOOL     IDENTIFICATION  Patient Name: [name] Birthdate: 1927/01/04 Sex: female Admission Date (Current Location): 01/19/2018  [facility] and [place] Number:  [name]:  [facility],  [name], [name]      Provider Number: [id number]  Attending Physician Name and Schroeder:  Charlynne Cousins, MD  Relative Name and Phone Number:       Current Level of Care: Hospital Recommended Level of Care: [name] Prior Approval Number:    Date Approved/Denied:   PASRR Number:   [id number]  Discharge Plan: SNF    Current Diagnoses: Patient Active Problem List   Diagnosis Date Noted  . Pallor   . Goals of care, counseling/discussion   . Pressure injury of skin 01/20/2018  . AKI (acute kidney injury) ([place]) 01/20/2018  . Hyponatremia 01/20/2018  . Essential hypertension 01/20/2018  . Macrocytic anemia 01/20/2018  . Hyperkalemia 01/20/2018  . Cellulitis of right leg 01/19/2018  . Duodenal ulcer perforation ([place]) 12/20/2017  . Intra-abdominal free air of unknown etiology   . Breast cancer, left breast ([place]) 04/03/2011    Orientation RESPIRATION BLADDER Height & Weight     Self, Place  Normal Continent Weight: 124 lb 9.6 oz (56.5 kg) Height:  5\' 2"  (157.5 cm)  BEHAVIORAL SYMPTOMS/MOOD NEUROLOGICAL BOWEL NUTRITION STATUS      Continent Diet  AMBULATORY STATUS COMMUNICATION OF NEEDS Skin   Extensive Assist Verbally Other (Comment)  sacral  bilateral lower extremity stasis dermatitis,                       Personal Care Assistance Level of Assistance  Bathing, Feeding Bathing Assistance: Maximum assistance Feeding assistance: Independent Dressing Assistance: Maximum assistance     Functional Limitations Info  Sight, Hearing, Speech Sight Info: Impaired Hearing Info: Impaired Speech Info: Adequate    SPECIAL CARE FACTORS FREQUENCY   PT (By licensed PT), OT (By licensed OT)     PT Frequency: 5x/week OT Frequency: 5x/week            Contractures Contractures Info: Not present    Additional Factors Info  Code Status, Allergies Code Status Info: Fullcode Allergies Info: Allergies: Aspirin           Current Medications (01/21/2018):  This is the current hospital active medication list Current Facility-Administered Medications  Medication Dose Route Frequency Provider Last Rate Last Dose  . 0.9 %  sodium chloride infusion (Manually program via Guardrails IV Fluids)   Intravenous Once Aileen Fass, Tammi Klippel, MD      . acetaminophen (TYLENOL) tablet 1,000 mg  1,000 mg Oral BID Merton Border, MD   1,000 mg at 01/21/18 0851  . atenolol (TENORMIN) tablet 100 mg  100 mg Oral q morning - 10a Merton Border, MD   100 mg at 01/21/18 0851  . ceFAZolin (ANCEF) IVPB 1 g/50 mL premix  1 g Intravenous Q12H Merton Border, MD 100 mL/hr at 01/21/18 1116 1 g at 01/21/18 1116  . cyanocobalamin ((VITAMIN B-12)) injection 1,000 mcg  1,000 mcg Intramuscular Once Aileen Fass, Tammi Klippel, MD      . Derrill Memo ON 01/22/2018] cyanocobalamin ((VITAMIN B-12)) injection 1,000 mcg  1,000 mcg Intramuscular Daily Charlynne Cousins, MD      . doxazosin (CARDURA) tablet 4 mg  4 mg Oral QHS Merton Border, MD   4 mg at 01/20/18 2238  . ferrous sulfate tablet  325 mg  325 mg Oral Q breakfast Merton Border, MD   325 mg at 01/21/18 0851  . heparin injection 5,000 Units  5,000 Units Subcutaneous Q8H Merton Border, MD   5,000 Units at 01/20/18 0612  . hydrALAZINE (APRESOLINE) tablet 50 mg  50 mg Oral BID Merton Border, MD   50 mg at 01/21/18 0851  . levothyroxine (SYNTHROID, LEVOTHROID) tablet 125 mcg  125 mcg Oral QAC breakfast Merton Border, MD   125 mcg at 01/21/18 0630  . ondansetron (ZOFRAN) tablet 4 mg  4 mg Oral Q6H PRN Merton Border, MD       Or  . ondansetron (ZOFRAN) injection 4 mg  4 mg Intravenous Q6H PRN Merton Border, MD      . pantoprazole (PROTONIX) EC tablet 40  mg  40 mg Oral Daily Merton Border, MD   40 mg at 01/21/18 0851  . sucralfate (CARAFATE) 1 GM/10ML suspension 1 g  1 g Oral TID WC & HS Merton Border, MD   1 g at 01/21/18 1200  . traMADol (ULTRAM) tablet 50 mg  50 mg Oral Q12H PRN Merton Border, MD   50 mg at 01/20/18 2238  . zolpidem (AMBIEN) tablet 5 mg  5 mg Oral QHS PRN Merton Border, MD         Discharge Medications: Please see discharge summary for a list of discharge medications.  Relevant Imaging Results:  Relevant Lab Results:   Additional Information SSN: [name], [place]

## 2018-01-21 NOTE — Care Management Note (Signed)
Case Management Note  Patient Details  Name: ROLAND PRINE MRN: [id number] Date of Birth: 1927-01-10  Subjective/Objective:                  Discharge planning   Action/Plan: tcf-Mike with [facility] canot take patient back due to home situation informed that snf placement is in progress.  Expected Discharge Date:  01/24/18               Expected Discharge Plan:  [place]  In-House Referral:  NA  Discharge planning Services  CM Consult  Post Acute Care Choice:  NA Choice offered to:  Patient, Adult Children  DME Arranged:  N/A DME Agency:  NA  HH Arranged:  NA HH Agency:  NA  Status of Service:  Completed, signed off  If discussed at [place] of Stay Meetings, dates discussed:    Additional Comments:  Leeroy Cha, RN 01/21/2018, 4:05 PM

## 2018-01-21 NOTE — Progress Notes (Signed)
CSW met with patient son Yvone Neu again to discuss SNF options. Patient son wants to pursue SNF- [facility]. CSW inform placement at the SNF depends on bed availability at the time of discharge.  Kathrin Greathouse, Marlinda Mike, MSW Clinical Social Worker  [phone] 01/21/2018  3:46 PM

## 2018-01-21 NOTE — Consult Note (Signed)
Consultation Note Date: 01/21/2018   Patient Name: [name]  DOB: 1926/12/08  MRN: [id number]  Age / Sex: 84 y.o., female  PCP: [name], MD Referring Physician: Charlynne Cousins, MD  Reason for Consultation: Establishing goals of care  HPI/Patient Profile: 84 y.o. female    admitted on 01/19/2018    Clinical Assessment and Goals of Care:  84 year old female admitted with cellulitis of lower extremity, acute kidney injury, has history of osteoporosis. Was recently hospitalized for duodenal perforation that was managed non surgically. Patient was discharged to [facility].  Patient has been admitted with two-week history of increased redness and oozing from ulcers of her right lower extremity. Wound care is following, she is on IV antibiotic.  Palliative consult for goals of care discussions.  Patient goes by the name of choice. She states that she is originally from [place], [place]. 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 She has a son and a daughter. She does not have a living will. She designates both her children son and daughter as healthcare decision makers if she is ever not able to make her own decisions.  I discussed with the patient about her current medical condition. Goals wishes and values discussed. CODE STATUS discussions undertaken. Additionally, call placed and also discussed with patient's son And Daughter Butch Penny as well. Their wishes are for the patient to eventually transition to [place].  Please note additional discussions as noted below, thank you for the consult.   HCPOA  son Katalyna Socarras who is a resp therapist.  Also has a daughter Butch Penny.   SUMMARY OF RECOMMENDATIONS    Ongoing discussions with patient, son and daughter about the differences between  full code and DNR DNI. Leaning towards DNR DNI, but they would like to discuss further amongst themselves, remain full code, until other wise notified by patient or family.   Son is asking about skilled level of care, patient hasn't been able to tolerate previous rehab attempt. He is looking to speak with CSW again  Continue current wound care and current mode of care.   Recommend palliative care following the patient on discharge.    Code Status/Advance Care Planning:  Full code    Symptom Management:    continue current mode of care.   Palliative Prophylaxis:   Delirium Protocol   Psycho-social/Spiritual:   Desire for further Chaplaincy support:yes  Additional Recommendations: Caregiving  Support/Resources  Prognosis:   Unable to determine  Discharge Planning: [place] for rehab with Palliative care service follow-up      Primary Diagnoses: Present on Admission: . Cellulitis of right leg   I have reviewed the medical record, interviewed the patient and family, and examined the patient. The following aspects are pertinent.  Past Medical History:  Diagnosis Date  . Arthritis    All over  . Back pain   . Cancer [facility]) 1996   breast cancer, left   .  Duodenal ulcer   . Edema leg   . Heart murmur    SLIGHT  . Hyperlipidemia   . Hypertension   . Hypothyroidism   . Spinal stenosis   . Thyroid disease   . Wears hearing aid    right   Social History   Socioeconomic History  . Marital status: Widowed    Spouse name: Not on file  . Number of children: Not on file  . Years of education: Not on file  . Highest education level: Not on file  Occupational History  . Not on file  Social Needs  . Financial resource strain: Not on file  . Food insecurity:    Worry: Not on file    Inability: Not on file  . Transportation needs:    Medical: Not on file    Non-medical: Not on file  Tobacco Use  . Smoking status: Never Smoker  . Smokeless  tobacco: Never Used  Substance and Sexual Activity  . Alcohol use: No  . Drug use: No  . Sexual activity: Not on file  Lifestyle  . Physical activity:    Days per week: Not on file    Minutes per session: Not on file  . Stress: Not on file  Relationships  . Social connections:    Talks on phone: Not on file    Gets together: Not on file    Attends religious service: Not on file    Active member of club or organization: Not on file    Attends meetings of clubs or organizations: Not on file    Relationship status: Not on file  Other Topics Concern  . Not on file  Social History Narrative  . Not on file   Family History  Family history unknown: Yes   Scheduled Meds: . sodium chloride   Intravenous Once  . acetaminophen  1,000 mg Oral BID  . atenolol  100 mg Oral q morning - 10a  . cyanocobalamin  1,000 mcg Intramuscular Once  . [START ON 01/22/2018] cyanocobalamin  1,000 mcg Intramuscular Daily  . doxazosin  4 mg Oral QHS  . ferrous sulfate  325 mg Oral Q breakfast  . heparin  5,000 Units Subcutaneous Q8H  . hydrALAZINE  50 mg Oral BID  . levothyroxine  125 mcg Oral QAC breakfast  . pantoprazole  40 mg Oral Daily  . sucralfate  1 g Oral TID WC & HS   Continuous Infusions: .  ceFAZolin (ANCEF) IV 1 g (01/21/18 1116)   PRN Meds:.ondansetron **OR** ondansetron (ZOFRAN) IV, traMADol, zolpidem Medications Prior to Admission:  Prior to Admission medications   Medication Sig Start Date End Date Taking? Authorizing Provider  acetaminophen (TYLENOL) 500 MG tablet Take 1,000 mg by mouth 2 (two) times daily.    Yes [provider]  atenolol (TENORMIN) 100 MG tablet Take 100 mg by mouth every morning.    Yes [provider]  doxazosin (CARDURA) 4 MG tablet Take 4 mg by mouth at bedtime.    Yes [provider]  feeding supplement, ENSURE ENLIVE, (ENSURE ENLIVE) LIQD Take 237 mLs by mouth 2 (two) times daily between meals. 12/25/17  Yes Ghimire, Henreitta Leber,  MD  ferrous sulfate 325 (65 FE) MG tablet Take 65 mg by mouth daily with breakfast.   Yes [provider]  furosemide (LASIX) 40 MG tablet Take 40 mg by mouth daily after breakfast.    Yes [provider]  hydrALAZINE (APRESOLINE) 25 MG tablet Take 50  mg by mouth 2 (two) times daily.  05/04/13  Yes [provider]  levothyroxine (SYNTHROID, LEVOTHROID) 125 MCG tablet Take 125 mcg by mouth daily before breakfast.    Yes [provider]  lisinopril (PRINIVIL,ZESTRIL) 10 MG tablet Take 1 tablet (10 mg total) by mouth every morning. 12/25/17  Yes Ghimire, Henreitta Leber, MD  Menthol-Methyl Salicylate (MUSCLE RUB) 10-15 % CREA Apply 1 application topically as needed for muscle pain.   Yes [provider]  NON FORMULARY Apply 1 application topically daily as needed (pain). Apply to back side  TOPICAL CREAM W/LIDOCAINE 02/06/17  Yes [provider]  omeprazole (PRILOSEC) 40 MG capsule Take 40 mg by mouth daily. 01/08/18  Yes [provider]  traMADol (ULTRAM) 50 MG tablet Take 1 tablet (50 mg total) by mouth every 12 (twelve) hours as needed for moderate pain. Patient taking differently: Take 50 mg by mouth 2 (two) times daily.  12/25/17  Yes Ghimire, Henreitta Leber, MD  amoxicillin-clavulanate (AUGMENTIN) 500-125 MG tablet Take 1 tablet (500 mg total) by mouth 2 (two) times daily. Patient not taking: Reported on 01/19/2018 12/25/17   Jonetta Osgood, MD  esomeprazole (NEXIUM) 40 MG capsule Take 1 capsule (40 mg total) by mouth 2 (two) times daily before a meal. Patient not taking: Reported on 01/19/2018 12/25/17   Jonetta Osgood, MD  potassium chloride SA (K-DUR,KLOR-CON) 20 MEQ tablet Take 2 tablets (40 mEq total) by mouth daily. Patient not taking: Reported on 01/19/2018 12/25/17   Jonetta Osgood, MD  sucralfate (CARAFATE) 1 GM/10ML suspension Take 10 mLs (1 g total) by mouth 4 (four) times daily -  with meals and at bedtime. Patient not taking:  Reported on 01/19/2018 12/25/17   Jonetta Osgood, MD   Allergies  Allergen Reactions  . Aspirin Other (See Comments)    Stomach bleed   Review of Systems Denies pain  a little hard of hearing  Physical Exam General exam: In no acute distress. Respiratory system: Good air movement and clear to auscultation. Cardiovascular system: S1 & S2 heard, RRR.  Gastrointestinal system: Abdomen is nondistended, soft and nontender.    Alert and oriented. No focal neurological deficits. Extremities: No   edema. Skin: No rashes, lesions or ulcers Vital Signs: BP (!) 95/54 (BP Location: Right Arm)   Pulse 61   Temp 99.2 F (37.3 C) (Oral)   Resp 15   Ht 5\' 2"  (1.575 m)   Wt 56.5 kg   SpO2 97%   BMI 22.79 kg/m  Pain Scale: 0-10   Pain Score: Asleep   SpO2: SpO2: 97 % O2 Device:SpO2: 97 % O2 Flow Rate: .   IO: Intake/output summary:   Intake/Output Summary (Last 24 hours) at 01/21/2018 1453 Last data filed at 01/21/2018 1245 Gross per 24 hour  Intake 1992.83 ml  Output 1150 ml  Net 842.83 ml    LBM: Last BM Date: 01/20/18 Baseline Weight: Weight: 56.5 kg Most recent weight: Weight: 56.5 kg     Palliative Assessment/Data:   PPS 40%  Time In:  10 Time Out:  11 Time Total:  60 min  Greater than 50%  of this time was spent counseling and coordinating care related to the above assessment and plan.  Signed by: Loistine Chance, MD  [phone]  Please contact Palliative Medicine Team phone at [phone] for questions and concerns.  For individual provider: See Shea Evans

## 2018-01-22 DIAGNOSIS — N179 Acute kidney failure, unspecified: Secondary | ICD-10-CM

## 2018-01-22 DIAGNOSIS — Z7189 Other specified counseling: Secondary | ICD-10-CM

## 2018-01-22 DIAGNOSIS — I878 Other specified disorders of veins: Secondary | ICD-10-CM

## 2018-01-22 DIAGNOSIS — E871 Hypo-osmolality and hyponatremia: Secondary | ICD-10-CM

## 2018-01-22 DIAGNOSIS — L03115 Cellulitis of right lower limb: Principal | ICD-10-CM

## 2018-01-22 DIAGNOSIS — E875 Hyperkalemia: Secondary | ICD-10-CM

## 2018-01-22 DIAGNOSIS — I5032 Chronic diastolic (congestive) heart failure: Secondary | ICD-10-CM

## 2018-01-22 DIAGNOSIS — M4800 Spinal stenosis, site unspecified: Secondary | ICD-10-CM

## 2018-01-22 DIAGNOSIS — M48061 Spinal stenosis, lumbar region without neurogenic claudication: Secondary | ICD-10-CM

## 2018-01-22 DIAGNOSIS — K265 Chronic or unspecified duodenal ulcer with perforation: Secondary | ICD-10-CM

## 2018-01-22 DIAGNOSIS — I10 Essential (primary) hypertension: Secondary | ICD-10-CM

## 2018-01-22 DIAGNOSIS — D539 Nutritional anemia, unspecified: Secondary | ICD-10-CM

## 2018-01-22 LAB — TYPE AND SCREEN
ABO/RH(D): O NEG
ANTIBODY SCREEN: NEGATIVE
UNIT DIVISION: 0
Unit division: 0

## 2018-01-22 LAB — BPAM RBC
Blood Product Expiration Date: 201911042359
Blood Product Expiration Date: 201912022359
ISSUE DATE / TIME: 201910291731
ISSUE DATE / TIME: 201910292128
UNIT TYPE AND RH: 9500
Unit Type and Rh: 9500

## 2018-01-22 LAB — BASIC METABOLIC PANEL
Anion gap: 6 (ref 5–15)
BUN: 40 mg/dL — AB (ref 8–23)
CHLORIDE: 105 mmol/L (ref 98–111)
CO2: 21 mmol/L — ABNORMAL LOW (ref 22–32)
CREATININE: 1.2 mg/dL — AB (ref 0.44–1.00)
Calcium: 7.5 mg/dL — ABNORMAL LOW (ref 8.9–10.3)
GFR calc Af Amer: 45 mL/min — ABNORMAL LOW (ref >60)
GFR calc non Af Amer: 39 mL/min — ABNORMAL LOW (ref >60)
Glucose, Bld: 96 mg/dL (ref 70–99)
Potassium: 4.7 mmol/L (ref 3.5–5.1)
SODIUM: 132 mmol/L — AB (ref 135–145)

## 2018-01-22 LAB — HEMOGLOBIN AND HEMATOCRIT, BLOOD
HCT: 26.3 % — ABNORMAL LOW (ref 36.0–46.0)
HEMOGLOBIN: 8.3 g/dL — AB (ref 12.0–15.0)

## 2018-01-22 MED ORDER — CEFAZOLIN SODIUM-DEXTROSE 1-4 GM/50ML-% IV SOLN
1.0000 g | Freq: Two times a day (BID) | INTRAVENOUS | Status: DC
  Administered 2018-01-22 – 2018-01-23 (×3): 1 g via INTRAVENOUS
  Filled 2018-01-22 (×4): qty 50

## 2018-01-22 MED ORDER — CEPHALEXIN 500 MG PO CAPS: 500.0000 mg | ORAL_CAPSULE | Freq: Three times a day (TID) | ORAL | 0 refills | Status: AC

## 2018-01-22 MED ORDER — TRAMADOL HCL 50 MG PO TABS: 50.0000 mg | ORAL_TABLET | Freq: Two times a day (BID) | ORAL | 0 refills | Status: AC | PRN

## 2018-01-22 MED ORDER — MUSCLE RUB 10-15 % EX CREA
TOPICAL_CREAM | Freq: Four times a day (QID) | CUTANEOUS | Status: DC | PRN
  Filled 2018-01-22: qty 85

## 2018-01-22 MED ORDER — FUROSEMIDE 40 MG PO TABS: 40.0000 mg | ORAL_TABLET | Freq: Every day | ORAL | Status: AC | PRN

## 2018-01-22 MED ORDER — PANTOPRAZOLE SODIUM 40 MG PO TBEC: 40.0000 mg | DELAYED_RELEASE_TABLET | Freq: Every day | ORAL | 0 refills | Status: AC

## 2018-01-22 MED ORDER — POTASSIUM CHLORIDE CRYS ER 20 MEQ PO TBCR: 40.0000 meq | EXTENDED_RELEASE_TABLET | Freq: Every day | ORAL | 0 refills | Status: AC | PRN

## 2018-01-22 NOTE — Care Management Important Message (Signed)
Important Message  Patient Details  Name: [name] MRN: [id number] Date of Birth: [birth year]   Medicare Important Message Given:  Yes    Kerin Salen 01/22/2018, 11:42 AMImportant Message  Patient Details  Name: [name] MRN: [id number] Date of Birth: [birth year]   Medicare Important Message Given:  Yes    Kerin Salen 01/22/2018, 11:42 AM

## 2018-01-22 NOTE — Progress Notes (Signed)
Patient discharging to [facility] SNF tomorrow (10/31) morning - facility cannot accept patient until then. Family and RN aware. Will need PTAR for transport.   Pricilla Holm, MSW, [place] [facility] [phone] (Weekend)

## 2018-01-22 NOTE — Discharge Summary (Addendum)
Physician Discharge Summary  [name] ZJQ:[id number] DOB: 1926/04/01 DOA: 01/19/2018  PCP: [name], MD  Admit date: 01/19/2018 Discharge date: 01/23/2018  Admitted From: home Disposition:  SNF   Recommendations for Outpatient Follow-up:  1. Avoid NSAIDs 2. Check CBC and Bmet in 5 days 3. Check BP BID- 4. Resume antihypertensives as needed 5. Daily weights and PRN Lasix and K to be given 6. Stop Keflex after evening dose on 01/26/18    Discharge Condition:  stable CODE STATUS:  Full code   Diet recommendation:  Heart healthy, low sodium Consultations:  Palliative care   Discharge Diagnoses:  Principal Problem:   Cellulitis of right leg Active Problems:  Chronic diastolic CHF (congestive heart failure) (HCC)   Chronic venous stasis   Spinal stenosis  Chronic diastolic CHF (congestive heart failure) (HCC)   Chronic venous stasis   Spinal stenosis   Hyperkalemia   Duodenal ulcer perforation (HCC)   Pressure injury of skin   AKI (acute kidney injury) ([place])   Hyponatremia   Essential hypertension   Macrocytic anemia   Goals of care, counseling/discussion   Brief Summary: [name] is an 84 y.o. female past medical history of bilateral lower extremity stasis dermatitis, recently discharged from our facility for a duodenal ulcer with preparations and a pelvic fracture, also with past medical history of sacral decubitus ulcer presents with 2-week history of increased redness and oozing from ulcers in the right leg  Hospital Course:  Cellulitis of right leg/ venous stasis with ulcers   - per family, she sits in a chair at home with legs hanging down all day- gets ACE wraps to legs - Started empirically on IV Ancef on admission. - temp as high at 99.2 - white count 12.9 improved to 10.2 - Wound care recommendations: Wound types: BLE consistent with venous stasis disease. Left posterior leg, wound measures 2cm x 3 cm and is 100% pink, heavily  exudating serous fluid. Right lower leg entire with multiple heavily exudating shallow wounds. Also consistent with venous stasis. Bilat lower legs erythematous, R > L. Plan of care for the areas: daily application of Aquacel Ag+, ABD pads and kerlex; next change date 10/29.    AKI (acute kidney injury) / hyponatremia/ hyperkalemia (ACE and AKI) Non anion gap metabolic acidosis Baseline creatinine of 1.2, on admission 1.6 - held Lasix and lisinopril - Cr improved to 1.20 today - CO2 12, improved to 21 today - sodium as low as 127- improved to 132 today - K 5.8, improved to 4.7 today  Grade 2 diastolic CHF - see ECHO from 06/17/17 - see above in regards to Lasix and Lisinopril - other antihypertensive also being held for now  Mild hypotension with h/o Essential hypertension Pressure has been ~ 118/64 and thus holding Lisinopril and Hydralazine in addition to Lasix - BP increased to 137/61 today  Macrocytic anemia B12 is 332  - started B12 supplementation intramuscularly. Hbg   6.7 and thus transfused 2 unit of PRBC bringing Hb up to 8.3   Recent Duodenal ulcer with perforation - she had a contained perforation - cont PPI, Carafate  Unstagable decubitus on coccyx Coccyx wound measure 1.5cm x 1.8cm, is 100% yellow base, unstagable PI. Plan of care for this area: twice saline moistened gauze and ABD pads.  Left buttock has an area with a rolled wound edge, likely related to friction, shear and moisture damage. Plan of care for this area: thin hydrocolloid, change every 3 days.  Additional  plan of care items include: antifungal powder to buttocks and perineum TID; Low air loss mattress; turning instructions and linen instructions.   Pelvic fracture (recent)    -WBAT as tolerated was recommended  Chronic back pain/spinal stenosis - cont tramadol BID PRN      Discharge Exam: Vitals:   01/22/18 2031 01/23/18 0537  BP: (!) 147/70 (!) 128/51  Pulse: 65 62  Resp: 20 16  Temp: 98  F (36.7 C)   SpO2: 97% 95%   Vitals:   01/22/18 0621 01/22/18 0832 01/22/18 2031 01/23/18 0537  BP: (!) 126/51 137/61 (!) 147/70 (!) 128/51  Pulse: 64 67 65 62  Resp: 15  20 16   Temp: 98.6 F (37 C)  98 F (36.7 C)   TempSrc: Oral  Oral   SpO2: 97%  97% 95%  Weight:      Height:        General: Pt is alert, awake, not in acute distress Cardiovascular: RRR, S1/S2 +, no rubs, no gallops Respiratory: CTA bilaterally, no wheezing, no rhonchi Abdominal: Soft, NT, ND, bowel sounds + Extremities: mild pedal edema of right leg- shallow ulcers on right shin     Discharge Instructions   Allergies as of 01/23/2018      Reactions   Aspirin Other (See Comments)   Stomach bleed      Medication List    STOP taking these medications   amoxicillin-clavulanate 500-125 MG tablet Commonly known as:  AUGMENTIN   esomeprazole 40 MG capsule Commonly known as:  NEXIUM   hydrALAZINE 25 MG tablet Commonly known as:  APRESOLINE   lisinopril 10 MG tablet Commonly known as:  PRINIVIL,ZESTRIL   omeprazole 40 MG capsule Commonly known as:  PRILOSEC Replaced by:  pantoprazole 40 MG tablet     TAKE these medications   acetaminophen 500 MG tablet Commonly known as:  TYLENOL Take 1,000 mg by mouth 2 (two) times daily.   atenolol 100 MG tablet Commonly known as:  TENORMIN Take 100 mg by mouth every morning.   cephALEXin 500 MG capsule Commonly known as:  KEFLEX Take 1 capsule (500 mg total) by mouth 3 (three) times daily for 4 days.   doxazosin 4 MG tablet Commonly known as:  CARDURA Take 4 mg by mouth at bedtime.   feeding supplement (ENSURE ENLIVE) Liqd Take 237 mLs by mouth 2 (two) times daily between meals.   ferrous sulfate 325 (65 FE) MG tablet Take 65 mg by mouth daily with breakfast.   furosemide 40 MG tablet Commonly known as:  LASIX Take 1 tablet (40 mg total) by mouth daily as needed. For weight gain of 2 lbs in 24 hrs or 3 lbs in 48 hrs What changed:     when to take this  reasons to take this  additional instructions   levothyroxine 125 MCG tablet Commonly known as:  SYNTHROID, LEVOTHROID Take 125 mcg by mouth daily before breakfast.   MUSCLE RUB 10-15 % Crea Apply 1 application topically as needed for muscle pain.   NON FORMULARY Apply 1 application topically daily as needed (pain). Apply to back side  TOPICAL CREAM W/LIDOCAINE   pantoprazole 40 MG tablet Commonly known as:  PROTONIX Take 1 tablet (40 mg total) by mouth daily. Replaces:  omeprazole 40 MG capsule   potassium chloride SA 20 MEQ tablet Commonly known as:  K-DUR,KLOR-CON Take 2 tablets (40 mEq total) by mouth daily as needed. When taking Lasix What changed:    when to take  this  reasons to take this  additional instructions   sucralfate 1 GM/10ML suspension Commonly known as:  CARAFATE Take 10 mLs (1 g total) by mouth 4 (four) times daily -  with meals and at bedtime.   traMADol 50 MG tablet Commonly known as:  ULTRAM Take 1 tablet (50 mg total) by mouth every 12 (twelve) hours as needed for moderate pain. What changed:    when to take this  reasons to take this       Allergies  Allergen Reactions  . Aspirin Other (See Comments)    Stomach bleed     Procedures/Studies:   Dg Chest Port 1 View  Result Date: 01/19/2018 CLINICAL DATA:  Edema and bilateral legs. EXAM: PORTABLE CHEST 1 VIEW COMPARISON:  May 25, 2008 FINDINGS: The study is limited due to positioning. Probable small right layering effusion. No overt edema. No acute abnormalities definitely feet. IMPRESSION: Limited study due to positioning. Probable tiny right pleural effusion. No other acute abnormalities noted. Electronically Signed   By: Dorise Bullion III M.D   On: 01/19/2018 17:28     The results of significant diagnostics from this hospitalization (including imaging, microbiology, ancillary and laboratory) are listed below for reference.     Microbiology: No  results found for this or any previous visit (from the past 240 hour(s)).   Labs: BNP (last 3 results) Recent Labs    01/19/18 2020  BNP 465.6*   Basic Metabolic Panel: Recent Labs  Lab 01/19/18 1713 01/20/18 0418 01/22/18 0256 01/23/18 0602  NA 127* 129* 132* 132*  K 5.8* 4.5 4.7 4.3  CL 99 103 105 105  CO2 17* 19* 21* 20*  GLUCOSE 103* 91 96 96  BUN 52* 49* 40* 29*  CREATININE 1.62* 1.60* 1.20* 0.98  CALCIUM 7.7* 7.6* 7.5* 7.7*   Liver Function Tests: No results for input(s): AST, ALT, ALKPHOS, BILITOT, PROT, ALBUMIN in the last 168 hours. No results for input(s): LIPASE, AMYLASE in the last 168 hours. No results for input(s): AMMONIA in the last 168 hours. CBC: Recent Labs  Lab 01/19/18 1713 01/20/18 0418 01/21/18 1248 01/22/18 0256  WBC 12.9*  --  10.2  --   NEUTROABS 9.9*  --  7.2  --   HGB 7.4*  --  6.7* 8.3*  HCT 26.6* 20.5* 21.7* 26.3*  MCV 107.7*  --  96.0  --   PLT 282  --  331  --    Cardiac Enzymes: No results for input(s): CKTOTAL, CKMB, CKMBINDEX, TROPONINI in the last 168 hours. BNP: Invalid input(s): POCBNP CBG: No results for input(s): GLUCAP in the last 168 hours. D-Dimer No results for input(s): DDIMER in the last 72 hours. Hgb A1c No results for input(s): HGBA1C in the last 72 hours. Lipid Profile No results for input(s): CHOL, HDL, LDLCALC, TRIG, CHOLHDL, LDLDIRECT in the last 72 hours. Thyroid function studies No results for input(s): TSH, T4TOTAL, T3FREE, THYROIDAB in the last 72 hours.  Invalid input(s): FREET3 Anemia work up No results for input(s): VITAMINB12, FOLATE, FERRITIN, TIBC, IRON, RETICCTPCT in the last 72 hours. Urinalysis    Component Value Date/Time   COLORURINE YELLOW 12/19/2017 2303   APPEARANCEUR CLEAR 12/19/2017 2303   LABSPEC 1.015 12/19/2017 2303   PHURINE 5.0 12/19/2017 2303   GLUCOSEU NEGATIVE 12/19/2017 2303   HGBUR NEGATIVE 12/19/2017 2303   BILIRUBINUR NEGATIVE 12/19/2017 2303   KETONESUR NEGATIVE  12/19/2017 2303   PROTEINUR 100 (A) 12/19/2017 2303   NITRITE NEGATIVE 12/19/2017 2303  LEUKOCYTESUR NEGATIVE 12/19/2017 2303   Sepsis Labs Invalid input(s): PROCALCITONIN,  WBC,  LACTICIDVEN Microbiology No results found for this or any previous visit (from the past 240 hour(s)).   Time coordinating discharge in minutes: 60  SIGNED:   Debbe Odea, MD  [facility] 01/23/2018, 9:50 AM Pager   If 7PM-7AM, please contact night-coverage [email or web address] Password [email or web address]

## 2018-01-22 NOTE — Care Management Note (Signed)
Case Management Note  Patient Details  Name: [name] MRN: [id number] Date of Birth: 1926-07-07  Subjective/Objective:                  Discharged back to snf  Action/Plan: Discharged to home with self-care, orders checked for hhc needs. No CM needs present at time of discharge.  Expected Discharge Date:  01/22/18               Expected Discharge Plan:  [place]  In-House Referral:  NA  Discharge planning Services  CM Consult  Post Acute Care Choice:  NA Choice offered to:  Patient, Adult Children  DME Arranged:  N/A DME Agency:  NA  HH Arranged:  NA HH Agency:  NA  Status of Service:  Completed, signed off  If discussed at [place] of Stay Meetings, dates discussed:    Additional Comments:  Leeroy Cha, RN 01/22/2018, 3:11 PM

## 2018-01-23 LAB — BASIC METABOLIC PANEL
Anion gap: 7 (ref 5–15)
BUN: 29 mg/dL — AB (ref 8–23)
CALCIUM: 7.7 mg/dL — AB (ref 8.9–10.3)
CO2: 20 mmol/L — AB (ref 22–32)
Chloride: 105 mmol/L (ref 98–111)
Creatinine, Ser: 0.98 mg/dL (ref 0.44–1.00)
GFR calc Af Amer: 58 mL/min — ABNORMAL LOW (ref >60)
GFR, EST NON AFRICAN AMERICAN: 50 mL/min — AB (ref >60)
Glucose, Bld: 96 mg/dL (ref 70–99)
Potassium: 4.3 mmol/L (ref 3.5–5.1)
Sodium: 132 mmol/L — ABNORMAL LOW (ref 135–145)

## 2018-01-23 NOTE — Progress Notes (Signed)
Given report to Lelon Frohlich, LPN at [facility]. Family notified. Patient will be transferred via [place].

## 2018-01-23 NOTE — Clinical Social Work Placement (Signed)
   CLINICAL SOCIAL WORK PLACEMENT  NOTE  Date:  01/23/2018  Patient Details  Name: [name] MRN: [id number] Date of Birth: January 12, 1927  Clinical Social Work is seeking post-discharge placement for this patient at the [place] level of care (*CSW will initial, date and re-position this form in  chart as items are completed):  Yes   Patient/family provided with [place] Work Department's list of facilities offering this level of care within the geographic area requested by the patient (or if unable, by the patient's family).  Yes   Patient/family informed of their freedom to choose among providers that offer the needed level of care, that participate in Medicare, Medicaid or managed care program needed by the patient, have an available bed and are willing to accept the patient.  Yes   Patient/family informed of [facility]'s ownership interest in [facility] and [facility], as well as of the fact that they are under no obligation to receive care at these facilities.  PASRR submitted to EDS on 01/21/18     PASRR number received on 01/21/18     Existing PASRR number confirmed on 01/21/18     FL2 transmitted to all facilities in geographic area requested by pt/family on 01/21/18     FL2 transmitted to all facilities within larger geographic area on 01/21/18     Patient informed that his/her managed care company has contracts with or will negotiate with certain facilities, including the following:        Yes   Patient/family informed of bed offers received.  Patient chooses bed at [facility]     Physician recommends and patient chooses bed at [facility]    Patient to be transferred to [facility] on 01/23/18.  Patient to be transferred to facility by PTAR     Patient family notified on 01/23/18 of transfer.  Name of family member notified:  Fayne Mediate     PHYSICIAN Please prepare prescriptions     Additional Comment:     _______________________________________________ Joellen Jersey, [place] 01/23/2018, 9:36 AM

## 2018-01-23 NOTE — Progress Notes (Signed)
[facility]  The patient did not discharge yesterday because of SNF availability. She is stable and ready to d/c today.  Please see my d/c summary from yesterday.   Debbe Odea, MD

## 2018-01-23 NOTE — Progress Notes (Addendum)
CSW spoke with Claiborne Billings at [facility]. Facility is ready to accept patient back this morning.  RN CALL FOR REPORT: [phone]  PTAR Called -- Pt. Transport at noon   [employer], [place] Social Worker [phone]

## 2018-01-28 ENCOUNTER — Non-Acute Institutional Stay: Payer: Medicare Other | Admitting: Internal Medicine

## 2018-01-28 DIAGNOSIS — S31000A Unspecified open wound of lower back and pelvis without penetration into retroperitoneum, initial encounter: Secondary | ICD-10-CM

## 2018-01-28 DIAGNOSIS — R531 Weakness: Secondary | ICD-10-CM

## 2018-01-28 DIAGNOSIS — Z515 Encounter for palliative care: Secondary | ICD-10-CM

## 2018-01-28 NOTE — Progress Notes (Signed)
PALLIATIVE CARE CONSULT VISIT   PATIENT NAME: [name] DOB: [birth year] MRN: [id number]  PRIMARY CARE PROVIDER:   [name], MD  REFERRING PROVIDER: Dr. Daylene Posey  RESPONSIBLE PARTY:   Kearia Yin (son) [phone]    RECOMMENDATIONS and PLAN:  1.  Weakness  R 53.1:  Multifactorial related to advanced age, heart failure and deconditioning.  Complete PT/OT strengthening and gait training.  She will need assistance in any residential setting related to hight fall risk.  Balanced meals and hydration.  Monitor.  2.  Sacral wound S31.00A: Medpass supplements at least qd.  Frequent repositioning and toileting. Leg elevation. Wound care management for sacrum and BLE.  3.Palliative care encounter Z51.5:  Reviewed pt goals and advanced care planning.  Pt desires to return to her home where her son assists in her care.  She also desires for code status to remain as full code with full scope of treatment.  Plan on discussion with son for additional discussion of future plans. Palliative care will continue to follow.   I spent 30 minutes providing this consultation,  from 2:30pm to 3:00pm at [facility]. More than 50% of the time in this consultation was spent coordinating communication pt and clinical staff.   HISTORY OF PRESENT ILLNESS:  FLORDIA KASSEM is a 84 y.o. year old female with multiple medical problems including chronic d CHF, anemia, sacral decubitus, cellulitis of R leg, chronic venous stasis, spinal stenosis and pelvic fracture. She was admitted to the hospital from 10/27-10/31/19 due to cellulitis of the R leg.  Staff reports that she needs assistance with ADLs, is able to feed self and pivots with assistance.  She also has chronic back pain.  Palliative Care was asked to help address goals of care and ACP  CODE STATUS:  FULL CODE  PPS: 40% HOSPICE ELIGIBILITY/DIAGNOSIS: TBD  PAST MEDICAL HISTORY:  Past Medical History:  Diagnosis Date  . Arthritis    All  over  . Back pain   . Cancer [facility]) 1996   breast cancer, left   . Duodenal ulcer   . Edema leg   . Heart murmur    SLIGHT  . Hyperlipidemia   . Hypertension   . Hypothyroidism   . Spinal stenosis   . Thyroid disease   . Wears hearing aid    right    SOCIAL HX:  Social History   Tobacco Use  . Smoking status: Never Smoker  . Smokeless tobacco: Never Used  Substance Use Topics  . Alcohol use: No    ALLERGIES:  Allergies  Allergen Reactions  . Aspirin Other (See Comments)    Stomach bleed     PERTINENT MEDICATIONS:  Outpatient Encounter Medications as of 01/28/2018  Medication Sig  . acetaminophen (TYLENOL) 500 MG tablet Take 1,000 mg by mouth 2 (two) times daily.   Marland Kitchen atenolol (TENORMIN) 100 MG tablet Take 100 mg by mouth every morning.   Marland Kitchen doxazosin (CARDURA) 4 MG tablet Take 4 mg by mouth at bedtime.   . feeding supplement, ENSURE ENLIVE, (ENSURE ENLIVE) LIQD Take 237 mLs by mouth 2 (two) times daily between meals.  . ferrous sulfate 325 (65 FE) MG tablet Take 65 mg by mouth daily with breakfast.  . furosemide (LASIX) 40 MG tablet Take 1 tablet (40 mg total) by mouth daily as needed. For weight gain of 2 lbs in 24 hrs or 3 lbs in 48 hrs  . levothyroxine (SYNTHROID, LEVOTHROID) 125 MCG tablet Take 125  mcg by mouth daily before breakfast.   . Menthol-Methyl Salicylate (MUSCLE RUB) 10-15 % CREA Apply 1 application topically as needed for muscle pain.  . NON FORMULARY Apply 1 application topically daily as needed (pain). Apply to back side  TOPICAL CREAM W/LIDOCAINE  . pantoprazole (PROTONIX) 40 MG tablet Take 1 tablet (40 mg total) by mouth daily.  . potassium chloride SA (K-DUR,KLOR-CON) 20 MEQ tablet Take 2 tablets (40 mEq total) by mouth daily as needed. When taking Lasix  . sucralfate (CARAFATE) 1 GM/10ML suspension Take 10 mLs (1 g total) by mouth 4 (four) times daily -  with meals and at bedtime. (Patient not taking: Reported on 01/19/2018)  . traMADol (ULTRAM) 50 MG  tablet Take 1 tablet (50 mg total) by mouth every 12 (twelve) hours as needed for moderate pain.   No facility-administered encounter medications on file as of 01/28/2018.     PHYSICAL EXAM:   General: Well nourished elderly female sitting at bedside.  In NAD Cardiovascular: regular rate and rhythm Pulmonary: clear all lung fields Abdomen: soft, nontender, + bowel sounds GU: no suprapubic tenderness Extremities: 2+ edema BLE Skin: Reports of sacral wound but not visualized, dressings of distal lower extremities and ankles Neurological: A&O x 3, weakness but otherwise nonfocal  Gonzella Lex, NP-C

## 2018-02-02 ENCOUNTER — Telehealth: Payer: Self-pay | Admitting: Internal Medicine

## 2018-02-02 NOTE — Telephone Encounter (Signed)
Phoned son, Jood Retana, and updated him on current status along with my Palliative Care visit with Ms. Gohr.  He will discuss possible available dates with his sister this weekend and inform this NP of those dates.  Family meeting will include patient. Advanced care planning will be readdressed during meeting.

## 2018-02-05 ENCOUNTER — Non-Acute Institutional Stay: Payer: Medicare Other | Admitting: Internal Medicine

## 2018-03-13 ENCOUNTER — Encounter: Payer: Self-pay | Admitting: Podiatry

## 2018-03-13 ENCOUNTER — Ambulatory Visit (INDEPENDENT_AMBULATORY_CARE_PROVIDER_SITE_OTHER): Payer: Medicare Other | Admitting: Podiatry

## 2018-03-13 DIAGNOSIS — B351 Tinea unguium: Secondary | ICD-10-CM

## 2018-03-13 DIAGNOSIS — M79676 Pain in unspecified toe(s): Secondary | ICD-10-CM | POA: Diagnosis not present

## 2018-03-13 DIAGNOSIS — L97511 Non-pressure chronic ulcer of other part of right foot limited to breakdown of skin: Secondary | ICD-10-CM

## 2018-03-13 NOTE — Progress Notes (Signed)
She presents today complaining of painful elongated toenails.  States that she has a hole in the bottom of her right foot that is being treated by wound care nurses who come from her primary care provider.  She just wants her toenails cut today.  Objective: Vital signs are stable she is alert and oriented x3 pulses remain palpable.  Moderate edema no erythema cellulitis drainage or odor open wound to the plantar aspect sub-first metatarsal phalangeal joint with granulation tissue does not probe to bone no purulence no malodor.  Toenails are long thick yellow dystrophic-like mycotic painful palpation as well as debridement.  Assessment: Pain in limb secondary to onychomycosis ulceration plantar aspect of the forefoot right non-complicated.  Plan: Continue current therapies with wound care.  Debrided toenails 1 through 5 bilateral.

## 2019-02-24 DEATH — deceased

## 2019-11-15 IMAGING — CT CT ABD-PELV W/ CM
2 of 5 series · 15 of 46 positions shown, 17 images · IV contrast (APPLIED)
Comparison: 02/04/2010

CLINICAL DATA: Lower abdominal pain for 2 days with symptoms of
constipation

EXAM:
CT ABDOMEN AND PELVIS WITH CONTRAST
TECHNIQUE: Multidetector CT imaging of the abdomen and pelvis was performed
using the standard protocol following bolus administration of
intravenous contrast.
CONTRAST:  80mL OMNIPAQUE IOHEXOL 300 MG/ML  SOLN

[Series 3: abdomen 5.0 · axial · 0.72mm/px · z∈[+862,+1212]mm · 12 of 82 slices shown, 14 images]
[im 6/82  soft-tissue]
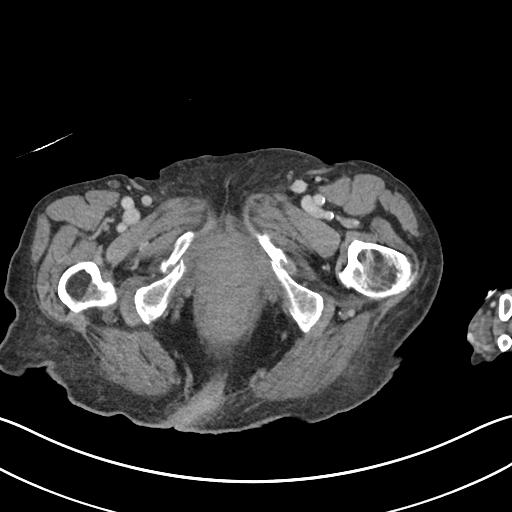
[im 6/82  bone]
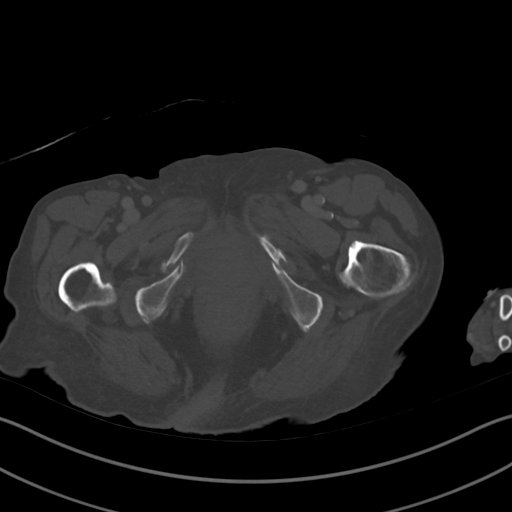
[im 11/82  soft-tissue]
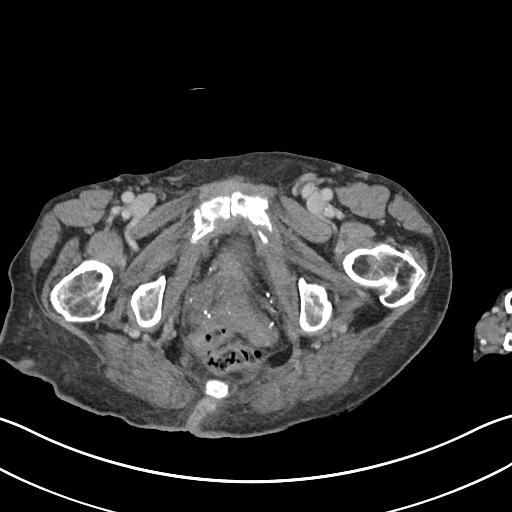
[im 21/82  soft-tissue]
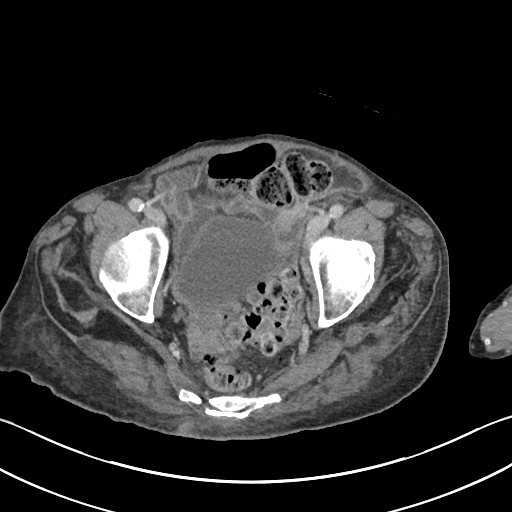
[im 26/82  soft-tissue]
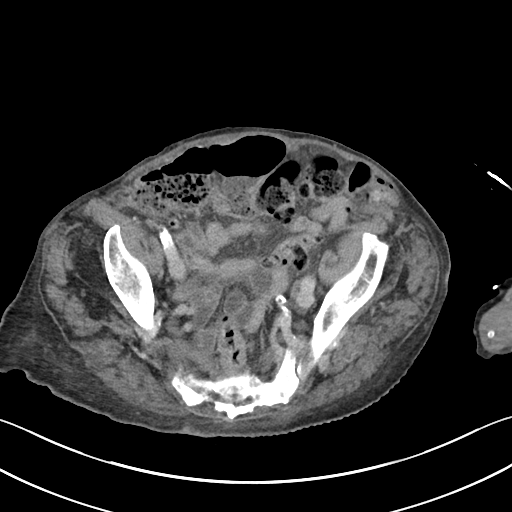
[im 31/82  soft-tissue]
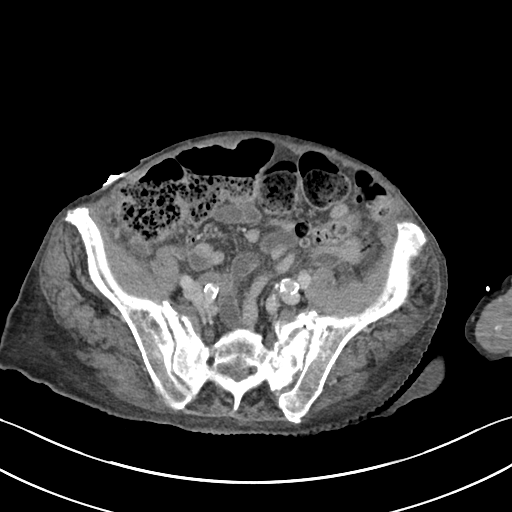
[im 36/82  soft-tissue]
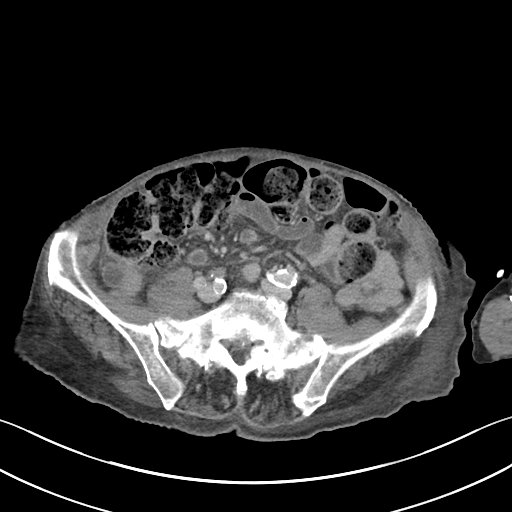
[im 46/82  soft-tissue]
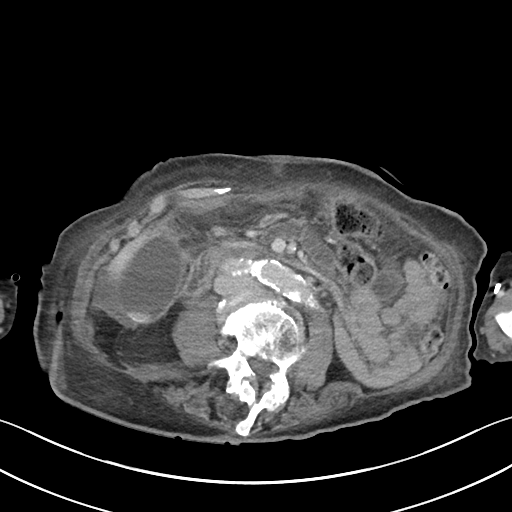
[im 51/82  soft-tissue]
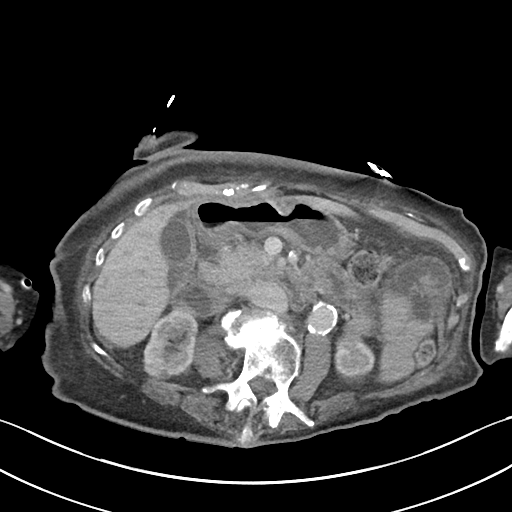
[im 56/82  soft-tissue]
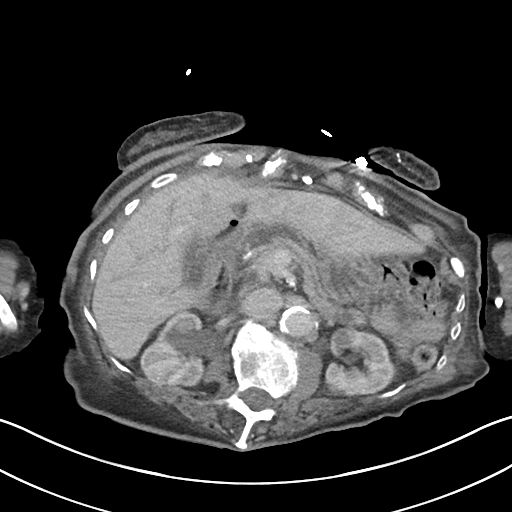
[im 56/82  bone]
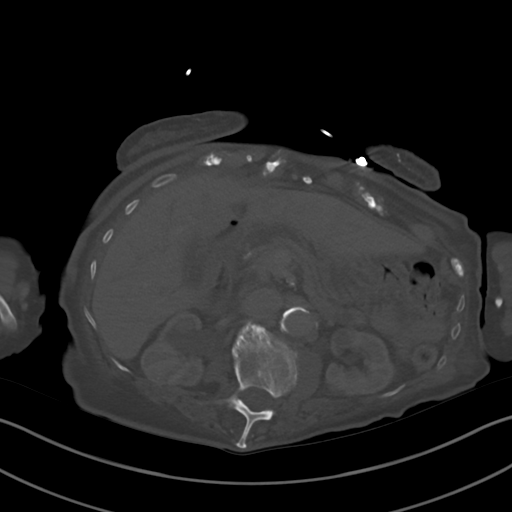
[im 61/82  soft-tissue]
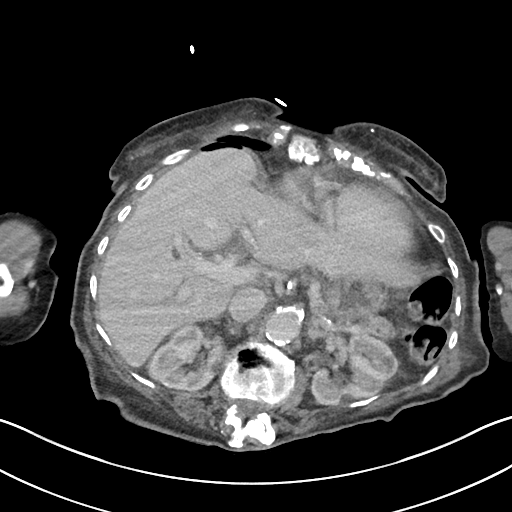
[im 71/82  soft-tissue]
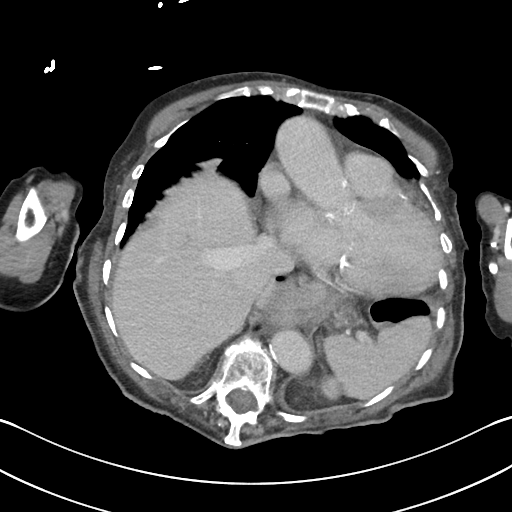
[im 76/82  soft-tissue]
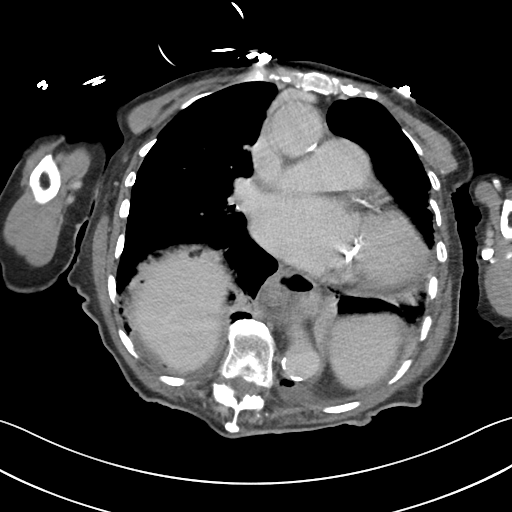

[Series 6: abdomen 3.0 mpr cor · coronal · 0.74mm/px · 3 of 101 slices shown]
[im 34/101  soft-tissue]
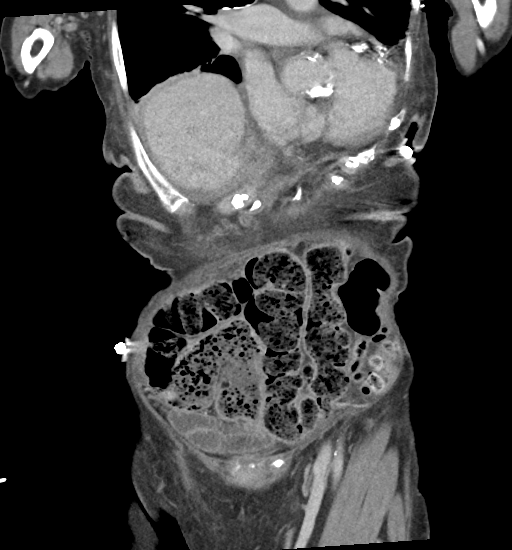
[im 45/101  soft-tissue]
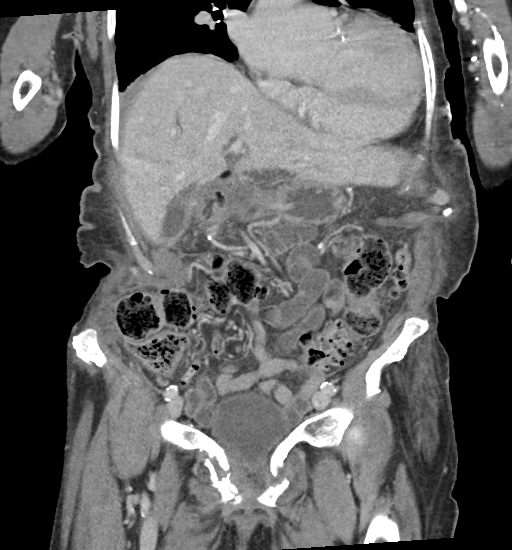
[im 56/101  soft-tissue]
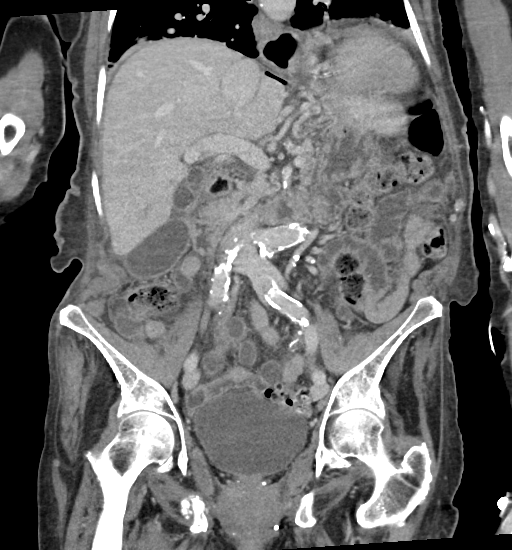

[15 of 46 positions shown; findings below may reference images not displayed]

FINDINGS: Lower chest: Lung bases demonstrate bilateral infiltrative
opacities. Small effusions are noted bilaterally. Additionally there
is a somewhat more rounded area of increased density in the medial
aspect of the left lower lobe. This measures 2.2 cm in transverse
diameter and previously measured approximately 11 mm in transverse
diameter. This has previously show no significant metabolic activity
although the increase in size is somewhat suspicious. Short-term
follow-up in 6 months is recommended.

Hepatobiliary: Gallbladder is well distended with evidence of
cholelithiasis. Some questionable pericholecystic fluid is noted. No
wall thickening is seen. The liver is within normal limits

Pancreas: Pancreas is well visualized. No definitive mass lesion is
noted.

Spleen: Normal in size without focal abnormality.

Adrenals/Urinary Tract: Adrenal glands are stable in appearance with
mild thickening. The kidneys demonstrate bilateral renal cystic
change. No obstructive changes are noted. Bladder is partially
distended.

Stomach/Bowel: Scattered diverticular change of the colon is noted
without evidence of diverticulitis. Moderate-sized hiatal hernia is
noted increased when compare with the prior exam. Some foci of free
air are noted adjacent to the liver as well as the first portion of
the duodenum. Some indistinct tissue planes are noted in this region
as well in the possibility of a perforated ulcer could not be
totally excluded.

Vascular/Lymphatic: Aortic atherosclerosis. No enlarged abdominal or
pelvic lymph nodes.

Reproductive: Status post hysterectomy. No adnexal masses.

Other: No abdominal wall hernia or abnormality. No abdominopelvic
ascites.

Musculoskeletal: Bilateral superior and inferior pubic rami
fractures are noted likely related to the patient's given clinical
history of recent fall. Bilateral sacral fractures are noted as
well. Only minimal displacement at the fracture sites is seen. There
is a right transverse process fracture at L5 no rib abnormalities
are noted. Severe degenerative changes of lumbar spine are noted
without definitive compression deformity.
IMPRESSION: Minimal free air in the upper abdomen adjacent to the liver some
indistinct tissue planes are noted in the region of the proximal
duodenum. The possibility of a minimally perforated duodenal ulcer
could not be totally excluded.

Multiple pelvic fractures as described above consistent with the
patient's given clinical history of recent fall.

Increase in size of soft tissue nodule within the medial aspect of
the left lung base when compared with previous exams dating back to
2222. This previously showed no significant metabolic activity on
PET-CT. The increase in size is somewhat suspicious however and
follow-up examination in 3-6 months is recommended

Bilateral lower lobe infiltrative changes. Small effusions are noted
as well.

Cholelithiasis with some suggestion of pericholecystic fluid. This
may be related to the adjacent duodenal changes.

Critical Value/emergent results were called by telephone at the time
of interpretation on 12/20/2017 at [DATE] to Dr. LANDO HERD , who
verbally acknowledged these results.
